# Patient Record
Sex: Male | Born: 1955 | Race: White | Hispanic: No | Marital: Married | Smoking: Never smoker
Health system: Southern US, Community
[De-identification: ages and names within clinical notes are randomized; demographics above are authoritative.]

## PROBLEM LIST (undated history)

## (undated) DIAGNOSIS — I10 Essential (primary) hypertension: Secondary | ICD-10-CM

## (undated) DIAGNOSIS — N400 Enlarged prostate without lower urinary tract symptoms: Secondary | ICD-10-CM

## (undated) DIAGNOSIS — Z789 Other specified health status: Secondary | ICD-10-CM

## (undated) HISTORY — PX: TONSILLECTOMY: SUR1361

## (undated) HISTORY — PX: TOOTH EXTRACTION: SUR596

---

## 1980-04-23 DIAGNOSIS — A159 Respiratory tuberculosis unspecified: Secondary | ICD-10-CM

## 1980-04-23 HISTORY — DX: Respiratory tuberculosis unspecified: A15.9

## 2006-08-16 ENCOUNTER — Ambulatory Visit: Payer: Self-pay | Admitting: Sports Medicine

## 2006-08-16 DIAGNOSIS — M2140 Flat foot [pes planus] (acquired), unspecified foot: Secondary | ICD-10-CM | POA: Insufficient documentation

## 2006-08-16 DIAGNOSIS — M21169 Varus deformity, not elsewhere classified, unspecified knee: Secondary | ICD-10-CM

## 2006-08-16 DIAGNOSIS — IMO0002 Reserved for concepts with insufficient information to code with codable children: Secondary | ICD-10-CM | POA: Insufficient documentation

## 2006-09-17 ENCOUNTER — Ambulatory Visit: Payer: Self-pay | Admitting: Sports Medicine

## 2006-09-17 DIAGNOSIS — M76829 Posterior tibial tendinitis, unspecified leg: Secondary | ICD-10-CM | POA: Insufficient documentation

## 2006-10-09 ENCOUNTER — Ambulatory Visit: Payer: Self-pay | Admitting: Sports Medicine

## 2006-11-12 ENCOUNTER — Ambulatory Visit: Payer: Self-pay | Admitting: Family Medicine

## 2008-03-30 ENCOUNTER — Ambulatory Visit: Payer: Self-pay | Admitting: Sports Medicine

## 2008-03-30 DIAGNOSIS — M79609 Pain in unspecified limb: Secondary | ICD-10-CM

## 2010-06-27 ENCOUNTER — Encounter (INDEPENDENT_AMBULATORY_CARE_PROVIDER_SITE_OTHER): Payer: BC Managed Care – PPO | Admitting: Sports Medicine

## 2010-06-27 ENCOUNTER — Encounter: Payer: Self-pay | Admitting: Sports Medicine

## 2010-06-27 DIAGNOSIS — M79609 Pain in unspecified limb: Secondary | ICD-10-CM

## 2010-06-27 DIAGNOSIS — M76829 Posterior tibial tendinitis, unspecified leg: Secondary | ICD-10-CM

## 2010-06-27 DIAGNOSIS — R269 Unspecified abnormalities of gait and mobility: Secondary | ICD-10-CM | POA: Insufficient documentation

## 2010-07-04 NOTE — Assessment & Plan Note (Signed)
Summary: ORTHOTICS PER [facility]   Vital Signs:  Patient profile:   55 year old male Height:      61 inches Weight:      120 pounds BMI:     22.76 BP sitting:   174 / 90  Vitals Entered By: Lillia Pauls CMA (June 27, 2010 9:23 AM)   History of Present Illness: Training for a 100 mile run doing a long run of 25 mpw and a total of 50 to 80 MPW originally had post tib tendinitis and chronic ankle pain since put into these orthotics has done well with much less pain orthotics now not stopping pain RT foot some forefoot pain both ankles getting more pressure now  current orthotics are 45 1/56 years old and have 4000+ miles  now showing signs of wear but still have 1st ray post and heel pad intact forefoot is broken down  Preventive Screening-Counseling & Management  Alcohol-Tobacco     Smoking Status: never  Allergies (verified): No Known Drug Allergies  Physical Exam  General:  Well-developed,well-nourished,in no acute distress; alert,appropriate and cooperative throughout examination Msk:  varus alignment RT leg is 1 cm shorter morton's foot bilat  marked pronation at mid foot also has reaer foot change w calcaneal valgus  forefoot shows loss of trnaverse arch bilat more on RT but no morton's callus Extremities:  Gait is pronated bilat without orthotics   Impression & Recommendations:  Problem # 1:  ABNORMALITY OF GAIT (ICD-781.2) Assessment Unchanged  this is corrected well w orthotics has tried goind without these for shorter runs and even w this gets a return of pain will need to cont these indefinitely for distance running  Patient was fitted for a standard, cushioned, semi-rigid orthotic.  The orthotic was heated and the patient stood on the orthotic blank positioned on the orthotic stand. The patient was positioned in subtalar neutral position and 10 degrees of ankle dorsiflexion in a weight bearing stance. After completion of molding a stable based was  applied to the orthotic blank.   The blank was ground to a stable position for weight bearing. size 9 blue swirl base blue med dens EVA posting f irst Ray additional orthotic padding  RT heel foam  time 45 mins  Orders: Orthotic Materials, each unit [phone])  Problem # 2:  LEG PAIN, RIGHT (ICD-729.5) Assessment: Deteriorated  this seems to return more on RT with increased orthotic wear  recushion this area and see if this resolves with new orthotic  reck as needed if working  Orders: Games developer, each unit [id number])  Problem # 3:  TENDINITIS, TIBIALIS (ICD-726.72) Assessment: Improved  This is recurrent but resolves as long as he uses orthtoics  seems 2/2 his gait change with marked pronatino  Orders: Orthotic Materials, each unit (L3002)   Orders Added: 1)  Est. Patient Level IV [09811] 2)  Orthotic Materials, each unit [L3002]  Appended Document: ORTHOTICS PER [facility] Pt's BP was rechecked manually and was 190/96- scheduled him for appt with Dr. Janalyn Harder at [facility] 07/10/10 to establish care, and have BP followed.

## 2010-07-10 ENCOUNTER — Ambulatory Visit: Payer: BC Managed Care – PPO | Admitting: Family Medicine

## 2014-03-30 ENCOUNTER — Encounter: Payer: Self-pay | Admitting: Sports Medicine

## 2014-03-30 ENCOUNTER — Ambulatory Visit (INDEPENDENT_AMBULATORY_CARE_PROVIDER_SITE_OTHER): Payer: BC Managed Care – PPO | Admitting: Sports Medicine

## 2014-03-30 VITALS — BP 147/93 | Ht 61.0 in | Wt 123.0 lb

## 2014-03-30 DIAGNOSIS — M25461 Effusion, right knee: Secondary | ICD-10-CM | POA: Diagnosis not present

## 2014-03-30 NOTE — Assessment & Plan Note (Signed)
Will use body helix comp  Cross train  Relative rest  NSAIDs

## 2014-03-30 NOTE — Progress Notes (Signed)
Patient ID: Justin Baxter, male   DOB: 09/24/1955, [age] y.o.   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  Pain started 5 days ago and is localized to right medial knee.  Pain is sharp and achy in nature.  Patient had been carrying his granddaughter prior to onset of pain.  The day after pain onset he also noticed right knee swelling.  Pain is worse with any weight-bearing and when putting valgus stress on his knee.  Pain also worse with movement after prolonged inactivity.  He took NSAIDS once with no relief.  He has been wearing a brace for support which helps mildly.  He does not feels this is not running related as there has been no significant trauma/injury or change in mileage.  He did recently run a 100 mile race.  He does have a history of fracture to right proximal tibia in 2007.  No numbness/tingling in leg.  He has no catching, locking, or giving out of this knee.  There has been no erythema and he has no history of gout, RA, or Lupus.  No fever, chills, night sweats, weight loss.  Objective: BP 147/93 mmHg  Ht 5\' 1"  (1.549 m)  Wt 123 lb (55.792 kg)  BMI 23.25 kg/m2 General: calm, cooperative, NAD HEENT: conj clear, sclera anicteric, [place]/AT Respiratory: breathing non-labored Cardiac: lower extremity pulses intact Neurologic: LE strength/sensation intact Musculoskeletal:  Right Knee Exam:  Inspection: large joint effusion; no bruising or ecchymosis or erythema  Palpation: tender over right medial joint line; no tenderness over pes bursa, hamstring tendons, quad tendons  ROM: passive flexion/extension normal; pain with flexion of 60 degrees  Anterior/Posterior drawer: negative  Lachmann's: negative  Varus/Valgus stress: negative  McMurrays: pain but no palpable click with testing of medial meniscus  Thessaly's: painful  Internal rotation of hip: negative  Ultrasound of Right Knee: -Large 5 cm long joint effusion  extending into SPP Baker's cyst of posterior knee tracking medially around semimembranosus and gastrocnemius tendons No real spurring noted -Meniscus appears grossly normal with exception of pseudocyst located on posterior aspect of medial joint line  Assessment/Plan:  Patient is a 58 y/o with atraumatic onset of right knee pain and effusion likely secondary to OCD lesion or cartilage contusion/ possible degen. meniscus  1.  Right Knee Pain 2.  Right Knee Effusion 3.  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  Offered joint aspiration/injection today but patient declined.  No obvious meniscal pathology on ultrasound but this remains in the differential.  Osteoarthritis also remains on differential given prior fracture of proximal tibia.  Will proceed with period of rest and activity modification.  Patient can use exercise bike as tolerated to maintain his fitness.  He was advised to wear compressive knee brace to help improve his right knee effusion.  He was encouraged to take scheduled NSAIDS for the next 7-10 days.  He will follow-up with us in 4 weeks to monitor for improvement.  Patient examined and discussed with Dr. Darrick PennaFields.  Mickle PlumbEvan Lutz, MD PGY-3 Family Medicine  Agree with evaluation and edited note/  Sterling BigKB Bonnee Zertuche, MD

## 2014-03-31 ENCOUNTER — Ambulatory Visit: Payer: BC Managed Care – PPO | Admitting: Sports Medicine

## 2014-05-04 ENCOUNTER — Ambulatory Visit (INDEPENDENT_AMBULATORY_CARE_PROVIDER_SITE_OTHER): Payer: BLUE CROSS/BLUE SHIELD | Admitting: Sports Medicine

## 2014-05-04 ENCOUNTER — Encounter: Payer: Self-pay | Admitting: Sports Medicine

## 2014-05-04 VITALS — BP 142/92 | Ht 61.0 in | Wt 123.0 lb

## 2014-05-04 DIAGNOSIS — M21161 Varus deformity, not elsewhere classified, right knee: Secondary | ICD-10-CM | POA: Diagnosis not present

## 2014-05-04 DIAGNOSIS — M25461 Effusion, right knee: Secondary | ICD-10-CM | POA: Diagnosis not present

## 2014-05-04 NOTE — Progress Notes (Signed)
  Justin Baxter - 59 y.o. male MRN [id number]  Date of birth: 05/21/1955  SUBJECTIVE:  Including CC & ROS.  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 When seen in early December in had developed sharp medial joint line pain and swelling. A large large effusion and baker cyst was seen on US. Patient treatment is swelling with compression sleeve and NSAID'S and feels the swelling has resolved but the intermittent medial jointline pain with extension is still present. No numbness/tingling in leg.  He has some catching,no  locking, or giving out of this knee.     ROS: Review of systems otherwise negative except for information present in HPI  HISTORY: Past Medical, Surgical, Social, and Family History Reviewed & Updated per EMR. Pertinent Historical Findings include:  He does have a history of fracture to right proximal tibia in 2007.   DATA REVIEWED: Review patient previous US from 03/30/14 which showed a large joint effusion and moderate bakers cyst tacking medially around semimembranosus/ gastroc. And pseudocyst on posterior aspect of medial jointline   PHYSICAL EXAM:  VS: BP:(!) 142/92 mmHg  HR: bpm  TEMP: ( )  RESP:   HT:5\' 1"  (154.9 cm)   WT:123 lb (55.792 kg)  BMI:23.3 KNEE EXAM:  General: well nourished, no acute distress Skin of LE: warm; dry, no rashes, lesions, ecchymosis or erythema. Vascular: Dorsal pedal pulses 2+ bilaterally Neurologically: Sensation to light touch lower extremities equal and intact  Normal to inspection with no erythema or effusion or obvious bony abnormalities. Palpation: Medial joint line tenderness Normal with no warmth, patellar tenderness, or condyle tenderness. ROM normal in flexion and extension and lower leg rotation. Range of motion:  ROM normal in flexion and extension and lower leg rotation. Ligaments with solid consistent endpoints  including ACL, PCL, LCL, MCL. Negative patella apprehension and normal tracking Meniscal evaluation: positive medial McMurray's test, painfulthessaly's test Hamstring and quadriceps strength is normal.  MSK US: Patient effusion has mostly resolved with only small amount in the suprapatellar recess, backer cyst has resolved, and pseudocyst has resolved and medial and lateral meniscus are normal   ASSESSMENT & PLAN: See problem based charting & AVS for pt instructions. Assessment/Plan:  Patient is a 59 y/o with atraumatic onset of right knee pain and effusion likely related to possible degen. Meniscus. Most of the effusion has resolved.   Recommendations: - Continue knee sleeve and provided patient with new sleeve today - Recommend some quad strengthening exercises - Cross-training for 2 more weeks then slowly progress back into running with half regular mileage the first 2 weeks back with a slow increase in mileage for 4 weeks

## 2017-03-05 ENCOUNTER — Encounter: Payer: Self-pay | Admitting: Sports Medicine

## 2017-03-05 ENCOUNTER — Ambulatory Visit: Payer: BLUE CROSS/BLUE SHIELD | Admitting: Family Medicine

## 2017-03-05 ENCOUNTER — Ambulatory Visit: Payer: BC Managed Care – PPO | Admitting: Sports Medicine

## 2017-03-05 VITALS — BP 178/100 | Ht 61.0 in | Wt 122.0 lb

## 2017-03-05 DIAGNOSIS — S76012A Strain of muscle, fascia and tendon of left hip, initial encounter: Secondary | ICD-10-CM

## 2017-03-05 MED ORDER — MELOXICAM 15 MG PO TABS: ORAL_TABLET | ORAL | 0 refills | Status: DC

## 2017-03-05 NOTE — Progress Notes (Signed)
Chief complaint: Left-sided glute pain x one day  History of present illness: Delton Seeelson is a 61 year old male who presents to the sports medicine office today with chief complaint of left-sided glue pain. He reports that symptoms started yesterday. He reports that he was cutting down a tree, was cutting it to about 3-4 sizes and then lifting it. He reports that after finishing he started noticing pain and discomfort in his low back on the left side of his glute muscles. He reports that symptoms progressively worsened throughout the course of the afternoon. Reports pain today as a 10/10. He describes the pain as a sharp, stabbing pain. He reports that the pain is nonradiating. He does not report of any numbness, tingling, or burning paresthesias. He does not report of any low back pain or hip pain. He does not report of any bowel or bladder incontinence, does not report of any saddle anesthesia. He does not report of any fevers, chills, night sweats, or any unintentional weight loss. He reports that he did take 2 Advil yesterday afternoon, notices minimal improvement in symptoms. He reports that he has been icing, with no improvement in symptoms.  Review of systems:  As stated above  Interval past medical history, surgical history, family history, and social history obtained and unchanged. He does have history of hypertension, no history of diabetes, does not report of any current tobacco use, no surgical history regarding back, does not report a family history of hypertension or any autoimmune disorder  Physical exam: Vital signs are reviewed and are documented in the chart Gen.: Alert, oriented, appears stated age, in no apparent distress HEENT: Moist oral mucosa Respiratory: Normal respirations, able to speak in full sentences Cardiac: Regular rate, distal pulses 2+ Integumentary: No rashes on visible skin:  Neurologic: Strength 5/5, sensation 2+ in bilateral lower extremities Psych: Normal affect,  mood is described as good Musculoskeletal: Inspection of low back and gluteal muscles reveal no obvious deformity or muscle atrophy, no warmth, erythema, ecchymosis, or effusion, he is point tender over the gluteal muscles on the left side, no tenderness over the lumbar spine, paraspinal lumbar processes, or SI joint bilaterally, no tenderness over the right gluteal muscles, straight leg negative bilaterally, FABER and FADIR negative, he has normal hip range of motion, does have good strength with hip flexion, hip extension, hip abductor, quad, hamstring strength testing  Assessment and plan: 1. Left gluteal muscle strain  Plan: Do not see any evidence on physical examination or history to be concerned regarding any type of radiculopathy or disc herniation, given negative symptoms of radiculopathy as well as negative straight leg testing bilaterally. Do feel that symptoms are consistent with a muscle strain over the left gluteal muscles. Do feel that he will do quite well with conservative therapy, discussed use of anti-inflammatory medication scheduled for the first 3-4 days, then daily as needed there afterwards. Will send in meloxicam 15 mg daily. Discussed home exercise program to do at home, alternating heat and ice. Discussed if no improvement in symptoms in the next 3-4 days to return to office for reevaluation.  Haynes Kernshristopher Jadis Mika, M.D. Primary Care Sports Medicine Fellow [facility]

## 2019-04-06 ENCOUNTER — Encounter (HOSPITAL_COMMUNITY): Payer: Self-pay | Admitting: Emergency Medicine

## 2019-04-06 ENCOUNTER — Emergency Department (HOSPITAL_COMMUNITY): Payer: BC Managed Care – PPO

## 2019-04-06 ENCOUNTER — Emergency Department (HOSPITAL_COMMUNITY)
Admission: EM | Admit: 2019-04-06 | Discharge: 2019-04-06 | Disposition: A | Discharge status: Home / Self Care | Payer: BC Managed Care – PPO | Attending: Emergency Medicine | Admitting: Emergency Medicine

## 2019-04-06 ENCOUNTER — Other Ambulatory Visit: Payer: Self-pay

## 2019-04-06 DIAGNOSIS — R1030 Lower abdominal pain, unspecified: Secondary | ICD-10-CM | POA: Diagnosis not present

## 2019-04-06 DIAGNOSIS — I10 Essential (primary) hypertension: Secondary | ICD-10-CM | POA: Insufficient documentation

## 2019-04-06 DIAGNOSIS — Z79899 Other long term (current) drug therapy: Secondary | ICD-10-CM | POA: Diagnosis not present

## 2019-04-06 DIAGNOSIS — R339 Retention of urine, unspecified: Secondary | ICD-10-CM | POA: Diagnosis present

## 2019-04-06 HISTORY — DX: Essential (primary) hypertension: I10

## 2019-04-06 LAB — CBC WITH DIFFERENTIAL/PLATELET
Abs Immature Granulocytes: 0.05 10*3/uL (ref 0.00–0.07)
Basophils Absolute: 0 10*3/uL (ref 0.0–0.1)
Basophils Relative: 0 %
Eosinophils Absolute: 0 10*3/uL (ref 0.0–0.5)
Eosinophils Relative: 0 %
HCT: 39.4 % (ref 39.0–52.0)
Hemoglobin: 13.2 g/dL (ref 13.0–17.0)
Immature Granulocytes: 0 %
Lymphocytes Relative: 3 %
Lymphs Abs: 0.5 10*3/uL — ABNORMAL LOW (ref 0.7–4.0)
MCH: 29.7 pg (ref 26.0–34.0)
MCHC: 33.5 g/dL (ref 30.0–36.0)
MCV: 88.5 fL (ref 80.0–100.0)
Monocytes Absolute: 0.8 10*3/uL (ref 0.1–1.0)
Monocytes Relative: 5 %
Neutro Abs: 14.9 10*3/uL — ABNORMAL HIGH (ref 1.7–7.7)
Neutrophils Relative %: 92 %
Platelets: 318 10*3/uL (ref 150–400)
RBC: 4.45 MIL/uL (ref 4.22–5.81)
RDW: 15.4 % (ref 11.5–15.5)
WBC: 16.3 10*3/uL — ABNORMAL HIGH (ref 4.0–10.5)
nRBC: 0 % (ref 0.0–0.2)

## 2019-04-06 LAB — URINALYSIS, ROUTINE W REFLEX MICROSCOPIC
Bilirubin Urine: NEGATIVE
Glucose, UA: NEGATIVE mg/dL
Ketones, ur: 5 mg/dL — AB
Leukocytes,Ua: NEGATIVE
Nitrite: NEGATIVE
Protein, ur: 30 mg/dL — AB
Specific Gravity, Urine: 1.021 (ref 1.005–1.030)
pH: 5 (ref 5.0–8.0)

## 2019-04-06 LAB — BASIC METABOLIC PANEL
Anion gap: 14 (ref 5–15)
BUN: 37 mg/dL — ABNORMAL HIGH (ref 8–23)
CO2: 24 mmol/L (ref 22–32)
Calcium: 9.5 mg/dL (ref 8.9–10.3)
Chloride: 99 mmol/L (ref 98–111)
Creatinine, Ser: 1.47 mg/dL — ABNORMAL HIGH (ref 0.61–1.24)
GFR calc Af Amer: 58 mL/min — ABNORMAL LOW (ref >60)
GFR calc non Af Amer: 50 mL/min — ABNORMAL LOW (ref >60)
Glucose, Bld: 142 mg/dL — ABNORMAL HIGH (ref 70–99)
Potassium: 4.1 mmol/L (ref 3.5–5.1)
Sodium: 137 mmol/L (ref 135–145)

## 2019-04-06 LAB — CK: Total CK: 1987 U/L — ABNORMAL HIGH (ref 49–397)

## 2019-04-06 MED ORDER — SODIUM CHLORIDE 0.9 % IV BOLUS
1000.0000 mL | Freq: Once | INTRAVENOUS | Status: AC
  Administered 2019-04-06: 13:00:00 1000 mL via INTRAVENOUS

## 2019-04-06 MED ORDER — IOHEXOL 300 MG/ML  SOLN
100.0000 mL | Freq: Once | INTRAMUSCULAR | Status: AC | PRN
  Administered 2019-04-06: 11:00:00 100 mL via INTRAVENOUS

## 2019-04-06 MED ORDER — SODIUM CHLORIDE 0.9 % IV SOLN
1.0000 g | Freq: Once | INTRAVENOUS | Status: AC
  Administered 2019-04-06: 13:00:00 1 g via INTRAVENOUS
  Filled 2019-04-06: qty 10

## 2019-04-06 MED ORDER — CEPHALEXIN 500 MG PO CAPS: 500.0000 mg | ORAL_CAPSULE | Freq: Four times a day (QID) | ORAL | 0 refills | Status: DC

## 2019-04-06 MED ORDER — HYDROCODONE-ACETAMINOPHEN 5-325 MG PO TABS
1.0000 | ORAL_TABLET | Freq: Once | ORAL | Status: DC
  Filled 2019-04-06: qty 1

## 2019-04-06 NOTE — ED Notes (Signed)
Patient verbalizes understanding of discharge instructions. Opportunity for questioning and answers were provided. Armband removed by staff, pt discharged from ED. Ambulated out to lobby  

## 2019-04-06 NOTE — ED Notes (Signed)
Unclamped catheter, another 567ml output

## 2019-04-06 NOTE — ED Triage Notes (Addendum)
Pt in with c/o abdominal pain, swelling, along with urinary retention and constipation. 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 Small BM yesterday, inability to pass gas and reports frequent burps w/nausea. Swollen area firm to touch, denies any worse pain on palpation. Unable to void since yesterday

## 2019-04-06 NOTE — ED Notes (Signed)
Pt given leg bag

## 2019-04-06 NOTE — ED Notes (Signed)
Pt initially put out 1L in catheter. Clamped temporarily

## 2019-04-06 NOTE — Discharge Instructions (Addendum)
Follow up with your Urologist. Increase your fluid intake.

## 2019-04-06 NOTE — ED Provider Notes (Signed)
MSE was initiated and I personally evaluated the patient and placed orders (if any) at  6:22 AM on April 06, 2019.  The patient appears stable so that the remainder of the MSE may be completed by another provider.  Patient to ED with urinary retention since yesterday afternoon (04/05/19). 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 He states during the race at the end he felt he needed to have a bowel movement, and felt that same urge several times on the drive home. They stopped multiple times and he would have only small, nonmelanic bowel movements. No nausea, vomiting, fever, chest pain, SOB, testicular pain or scrotal swelling.   ON exam: he is well appearing. Distended and tender lower abdomen c/w urinary retention/distended bladder. He is hypertensive but reports he did not take his medication yesterday.   Initial labs ordered, bladder scan and foley cath.   Charlann Lange, PA-C 04/06/19 5747    Fatima Blank, MD 04/06/19 [phone]

## 2019-04-07 LAB — URINE CULTURE: Culture: NO GROWTH

## 2019-04-20 NOTE — ED Provider Notes (Signed)
[facility] Provider Note   CSN: [id number] Arrival date & time: 04/06/19  0554     History Chief Complaint  Patient presents with  . Abdominal Pain    Justin Baxter is a 63 y.o. male.  HPI   Patient presents to the emergency department with urinary retention that started last night.  The patient states that he started having abdominal pain right over the bladder region with distention.  The patient states that he was having trouble urinating.  The patient states that he does have a history of enlarged prostate.  Patient states that he has seen a urologist in the past.  Patient states that he has not started any new medications.  The patient denies chest pain, shortness of breath, headache,blurred vision, neck pain, fever, cough, weakness, numbness, dizziness, anorexia, edema, nausea, vomiting, diarrhea, rash, back pain, dysuria, hematemesis, bloody stool, near syncope, or syncope. Past Medical History:  Diagnosis Date  . Hypertension     Patient Active Problem List   Diagnosis Date Noted  . Effusion of right knee joint 03/30/2014  . ABNORMALITY OF GAIT 06/27/2010  . LEG PAIN, RIGHT 03/30/2008  . TENDINITIS, TIBIALIS 09/17/2006  . ANSERINE BURSITIS, LEFT 08/16/2006  . PES PLANUS 08/16/2006  . 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 No pertinent surgical history.     No family history on file.  Social History   Tobacco Use  . Smoking status: Never Smoker  . Smokeless tobacco: Never Used  Substance Use Topics  . Alcohol use: Yes    Alcohol/week: 0.0 standard drinks  . Drug use: Never    Home Medications Prior to Admission medications   Medication Sig Start Date End Date Taking? Authorizing Provider  cephALEXin (KEFLEX) 500 MG capsule Take 1 capsule (500 mg total) by mouth 4 (four) times daily. 04/06/19   Cheryl Stabenow, Cristal Deerhristopher, PA-C  meloxicam (MOBIC) 15 MG tablet Take 1 pill a day for 3 days. Then take as  needed. 03/05/17   Ralene Corkraper, Timothy R, DO  valsartan (DIOVAN) 160 MG tablet Take 160 mg by mouth daily. 02/04/14   [provider]    Allergies    Patient has no known allergies.  Review of Systems   Review of Systems All other systems negative except as documented in the HPI. All pertinent positives and negatives as reviewed in the HPI.  Physical Exam Updated Vital Signs BP 133/74   Pulse 61   Temp 97.9 F (36.6 C) (Oral)   Resp 20   Wt 55.3 kg   SpO2 99%   BMI 23.04 kg/m   Physical Exam Vitals and nursing note reviewed.  Constitutional:      General: He is not in acute distress.    Appearance: He is well-developed.  HENT:     Head: Normocephalic and atraumatic.  Eyes:     Pupils: Pupils are equal, round, and reactive to light.  Cardiovascular:     Rate and Rhythm: Normal rate and regular rhythm.     Heart sounds: Normal heart sounds. No murmur. No friction rub. No gallop.   Pulmonary:     Effort: Pulmonary effort is normal. No respiratory distress.     Breath sounds: Normal breath sounds. No wheezing.  Abdominal:     General: Bowel sounds are normal. There is no distension.     Palpations: Abdomen is soft.     Tenderness: There is abdominal tenderness in the right lower quadrant, suprapubic area and left lower  quadrant.  Musculoskeletal:     Cervical back: Normal range of motion and neck supple.  Skin:    General: Skin is warm and dry.     Capillary Refill: Capillary refill takes less than 2 seconds.     Findings: No erythema or rash.  Neurological:     Mental Status: He is alert and oriented to person, place, and time.     Motor: No abnormal muscle tone.     Coordination: Coordination normal.  Psychiatric:        Behavior: Behavior normal.     ED Results / Procedures / Treatments   Labs (all labs ordered are listed, but only abnormal results are displayed) Labs Reviewed  CBC WITH DIFFERENTIAL/PLATELET - Abnormal; Notable for the following  components:      Result Value   WBC 16.3 (*)    Neutro Abs 14.9 (*)    Lymphs Abs 0.5 (*)    All other components within normal limits  BASIC METABOLIC PANEL - Abnormal; Notable for the following components:   Glucose, Bld 142 (*)    BUN 37 (*)    Creatinine, Ser 1.47 (*)    GFR calc non Af Amer 50 (*)    GFR calc Af Amer 58 (*)    All other components within normal limits  URINALYSIS, ROUTINE W REFLEX MICROSCOPIC - Abnormal; Notable for the following components:   Hgb urine dipstick MODERATE (*)    Ketones, ur 5 (*)    Protein, ur 30 (*)    Bacteria, UA RARE (*)    All other components within normal limits  CK - Abnormal; Notable for the following components:   Total CK 1,987 (*)    All other components within normal limits  URINE CULTURE    EKG None  Radiology No results found.  Procedures Procedures (including critical care time)  Medications Ordered in ED Medications  iohexol (OMNIPAQUE) 300 MG/ML solution 100 mL (100 mLs Intravenous Contrast Given 04/06/19 1101)  sodium chloride 0.9 % bolus 1,000 mL (0 mLs Intravenous Stopped 04/06/19 1521)  cefTRIAXone (ROCEPHIN) 1 g in sodium chloride 0.9 % 100 mL IVPB (0 g Intravenous Stopped 04/06/19 1521)    ED Course  I have reviewed the triage vital signs and the nursing notes.  Pertinent labs & imaging results that were available during my care of the patient were reviewed by me and considered in my medical decision making (see chart for details).    MDM Rules/Calculators/A&P                      The patient is given antibiotics and will be discharged home on antibiotics.  The patient is advised he will need to leave the Foley catheter in place until he follows up with urology.  Patient is advised of the results and all questions were answered.  The patient had significant relief of his symptoms after draining his bladder.  Patient still had some discomfort therefore we did order a CT scan to further evaluate his  symptoms to make sure there was no significant abnormality it was causing an issue for the patient.  Patient will be referred to urology for further evaluation and care. Final Clinical Impression(s) / ED Diagnoses Final diagnoses:  Urinary retention    Rx / DC Orders ED Discharge Orders         Ordered    cephALEXin (KEFLEX) 500 MG capsule  4 times daily     04/06/19 1440  Charlestine Night, PA-C 04/25/19 Sharman Crate    Arby Barrette, MD 04/25/19 1444

## 2019-04-30 ENCOUNTER — Ambulatory Visit: Payer: BC Managed Care – PPO | Attending: Internal Medicine

## 2019-04-30 DIAGNOSIS — Z20822 Contact with and (suspected) exposure to covid-19: Secondary | ICD-10-CM

## 2019-05-02 LAB — NOVEL CORONAVIRUS, NAA: SARS-CoV-2, NAA: NOT DETECTED

## 2019-06-04 ENCOUNTER — Ambulatory Visit: Payer: BC Managed Care – PPO | Attending: Internal Medicine

## 2019-06-04 DIAGNOSIS — Z23 Encounter for immunization: Secondary | ICD-10-CM | POA: Insufficient documentation

## 2019-06-04 NOTE — Progress Notes (Signed)
   Covid-19 Vaccination Clinic  Name:  Justin Baxter    MRN: [id number] DOB: 1955/08/01  06/04/2019  Mr. [name] was observed post Covid-19 immunization for 15 minutes without incidence. He was provided with Vaccine Information Sheet and instruction to access the V-Safe system.   Mr. [name] was instructed to call 911 with any severe reactions post vaccine: Marland Kitchen Difficulty breathing  . Swelling of your face and throat  . A fast heartbeat  . A bad rash all over your body  . Dizziness and weakness    Immunizations Administered    Name Date Dose VIS Date Route   Pfizer COVID-19 Vaccine 06/04/2019  2:02 PM 0.3 mL 04/03/2019 Intramuscular   Manufacturer: [employer], [employer]   Lot: [id number]   NDC: [address]-0

## 2019-06-27 ENCOUNTER — Ambulatory Visit: Payer: BC Managed Care – PPO | Attending: Internal Medicine

## 2019-06-27 DIAGNOSIS — Z23 Encounter for immunization: Secondary | ICD-10-CM

## 2019-06-27 NOTE — Progress Notes (Signed)
   Covid-19 Vaccination Clinic  Name:  Justin Baxter    MRN: [id number] DOB: 11/20/1955  06/27/2019  [name] was observed post Covid-19 immunization for 15 minutes without incident. He was provided with Vaccine Information Sheet and instruction to access the V-Safe system.   [name] was instructed to call 911 with any severe reactions post vaccine: Marland Kitchen Difficulty breathing  . Swelling of face and throat  . A fast heartbeat  . A bad rash all over body  . Dizziness and weakness   Immunizations Administered    Name Date Dose VIS Date Route   Pfizer COVID-19 Vaccine 06/27/2019 12:37 PM 0.3 mL 04/03/2019 Intramuscular   Manufacturer: [employer], [employer]   Lot: [id number]   NDC: [address]-1

## 2019-06-29 ENCOUNTER — Other Ambulatory Visit: Payer: Self-pay | Admitting: Urology

## 2019-07-02 NOTE — Patient Instructions (Addendum)
DUE TO COVID-19 ONLY ONE VISITOR IS ALLOWED TO COME WITH YOU AND STAY IN THE WAITING ROOM ONLY DURING PRE OP AND PROCEDURE DAY OF SURGERY. THE 1 VISITOR MAY VISIT WITH YOU AFTER SURGERY IN YOUR PRIVATE ROOM DURING VISITING HOURS ONLY!  YOU NEED TO HAVE A COVID 19 TEST ON__4/16_____ @__9 :05_____, THIS TEST MUST BE DONE BEFORE SURGERY, COME  [address], [place] , [address].  ([place]) ONCE YOUR COVID TEST IS COMPLETED, PLEASE BEGIN THE QUARANTINE INSTRUCTIONS AS OUTLINED IN YOUR HANDOUT.                Shellee Milo    Your procedure is scheduled on: 08/11/19   Report to [facility] Main  Entrance   Report to admitting at  9:30 AM     Call this number if you have problems the morning of surgery [phone]    Remember: Do not eat food or drink liquids :After Midnight.   BRUSH YOUR TEETH MORNING OF SURGERY AND RINSE YOUR MOUTH OUT, NO CHEWING GUM CANDY OR MINTS.     Take these medicines the morning of surgery with A SIP OF WATER: Tamsulosin                                 You may not have any metal on your body including              piercings  Do not wear jewelry,  lotions, powders or  deodorant               Men may shave face and neck.   Do not bring valuables to the hospital. [place].  Contacts, dentures or bridgework may not be worn into surgery.      Patients discharged the day of surgery will not be allowed to drive home.   IF YOU ARE HAVING SURGERY AND GOING HOME THE SAME DAY, YOU MUST HAVE AN ADULT TO DRIVE YOU HOME AND BE WITH YOU FOR 24 HOURS.   YOU MAY GO HOME BY TAXI OR UBER OR ORTHERWISE, BUT AN ADULT MUST ACCOMPANY YOU HOME AND STAY WITH YOU FOR 24 HOURS.  Name and phone number of your driver:  Special Instructions: N/A              Please read over the following fact sheets you were given: _____________________________________________________________________  [facility] - Preparing for Surgery  Before surgery, you can play an important role.   Because skin is not sterile, your skin needs to be as free of germs as possible.   You can reduce the number of germs on your skin by washing with CHG (chlorahexidine gluconate) soap before surgery.   CHG is an antiseptic cleaner which kills germs and bonds with the skin to continue killing germs even after washing. Please DO NOT use if you have an allergy to CHG or antibacterial soaps.   If your skin becomes reddened/irritated stop using the CHG and inform your nurse when you arrive at Short Stay.   You may shave your face/neck. Please follow these instructions carefully:  1.  Shower with CHG Soap the night before surgery and the  morning of Surgery.  2.  If you choose to wash your hair, wash your hair first as usual with your  normal  shampoo.  3.  After you shampoo, rinse your hair and body thoroughly to remove the  shampoo.                                        4.  Use CHG as you would any other liquid soap.  You can apply chg directly  to the skin and wash                       Gently with a scrungie or clean washcloth.  5.  Apply the CHG Soap to your body ONLY FROM THE NECK DOWN.   Do not use on face/ open                           Wound or open sores. Avoid contact with eyes, ears mouth and genitals (private parts).                       Wash face,  Genitals (private parts) with your normal soap.             6.  Wash thoroughly, paying special attention to the area where your surgery  will be performed.  7.  Thoroughly rinse your body with warm water from the neck down.  8.  DO NOT shower/wash with your normal soap after using and rinsing off  the CHG Soap.             9.  Pat yourself dry with a clean towel.            10.  Wear clean pajamas.            11.  Place clean sheets on your bed the night of your first shower and do not  sleep with pets. Day of Surgery : Do not apply any lotions/deodorants  the morning of surgery.  Please wear clean clothes to the hospital/surgery center.  FAILURE TO FOLLOW THESE INSTRUCTIONS MAY RESULT IN THE CANCELLATION OF YOUR SURGERY PATIENT SIGNATURE_________________________________  NURSE SIGNATURE__________________________________  ________________________________________________________________________

## 2019-07-03 ENCOUNTER — Other Ambulatory Visit: Payer: Self-pay

## 2019-07-03 ENCOUNTER — Other Ambulatory Visit (HOSPITAL_COMMUNITY): Payer: BC Managed Care – PPO

## 2019-07-03 ENCOUNTER — Encounter (HOSPITAL_COMMUNITY): Payer: Self-pay

## 2019-07-03 ENCOUNTER — Encounter (HOSPITAL_COMMUNITY)
Admission: RE | Admit: 2019-07-03 | Discharge: 2019-07-03 | Disposition: A | Discharge status: Home / Self Care | Payer: BC Managed Care – PPO | Source: Ambulatory Visit | Attending: Urology | Admitting: Urology

## 2019-07-03 ENCOUNTER — Encounter (HOSPITAL_COMMUNITY): Admission: RE | Admit: 2019-07-03 | Payer: BC Managed Care – PPO | Source: Ambulatory Visit

## 2019-07-03 NOTE — Progress Notes (Signed)
PCP - Dr. Marcy Siren Cardiologist - none  Chest x-ray - none EKG - none Stress Test - no ECHO - no Cardiac Cath - no  Sleep Study - no CPAP -   Fasting Blood Sugar - NA Checks Blood Sugar _____ times a day  Blood Thinner Instructions:NA Aspirin Instructions: Last Dose:  Anesthesia review:   Patient denies shortness of breath, fever, cough and chest pain at PAT appointment yes  Patient verbalized understanding of instructions that were given to them at the PAT appointment. Patient was also instructed that they will need to review over the PAT instructions again at home before surgery. yes

## 2019-07-31 ENCOUNTER — Encounter (HOSPITAL_COMMUNITY): Payer: Self-pay

## 2019-07-31 NOTE — Patient Instructions (Signed)
DUE TO COVID-19 ONLY TWO VISITORS ARE ALLOWED TO COME WITH YOU AND STAY IN THE WAITING ROOM ONLY DURING PRE OP AND PROCEDURE. THE TWO VISITORS MAY VISIT WITH YOU IN YOUR PRIVATE ROOM DURING VISITING HOURS ONLY!!   COVID SWAB TESTING MUST BE COMPLETED ON:  Friday, August 07, 2019 [facility] [place] (Must self quarantine after testing. Follow instructions on handout.)             Your procedure is scheduled on: Tuesday, August 11, 2019   Report to [facility] Main  Entrance    Report to admitting at 9:30 AM   Call this number if you have problems the morning of surgery [phone]   Do not eat food or drink liquids :After Midnight.   Oral Hygiene is also important to reduce your risk of infection.                                    Remember - BRUSH YOUR TEETH THE MORNING OF SURGERY WITH YOUR REGULAR TOOTHPASTE   Do NOT smoke after Midnight   Take these medicines the morning of surgery with A SIP OF WATER: Tamsulosin                               You may not have any metal on your body including jewelry, and body piercings             Do not wear lotions, powders, perfumes/cologne, or deodorant                           Men may shave face and neck.   Do not bring valuables to the hospital. [place].   Contacts, dentures or bridgework may not be worn into surgery.   Bring small overnight bag day of surgery.   Special Instructions: Bring a copy of your healthcare power of attorney and living will documents         the day of surgery if you haven't scanned them in before.              Please read over the following fact sheets you were given: IF YOU HAVE QUESTIONS ABOUT YOUR PRE OP INSTRUCTIONS PLEASE CALL [phone]  [place] - Preparing for Surgery Before surgery, you can play an important role.  Because skin is not sterile, your  skin needs to be as free of germs as possible.  You can reduce the number of germs on your skin by washing with CHG (chlorahexidine gluconate) soap before surgery.  CHG is an antiseptic cleaner which kills germs and bonds with the skin to continue killing germs even after washing. Please DO NOT use if you have an allergy to CHG or antibacterial soaps.  If your skin becomes reddened/irritated stop using the CHG and inform your nurse when you arrive at Short Stay. Do not shave (including legs and underarms) for at least 48 hours prior to the first CHG shower.  You may shave your face/neck.  Please follow these instructions carefully:  1.  Shower with CHG Soap the night before surgery and the  morning  of surgery.  2.  If you choose to wash your hair, wash your hair first as usual with your normal  shampoo.  3.  After you shampoo, rinse your hair and body thoroughly to remove the shampoo.                             4.  Use CHG as you would any other liquid soap.  You can apply chg directly to the skin and wash.  Gently with a scrungie or clean washcloth.  5.  Apply the CHG Soap to your body ONLY FROM THE NECK DOWN.   Do   not use on face/ open                           Wound or open sores. Avoid contact with eyes, ears mouth and   genitals (private parts).                       Wash face,  Genitals (private parts) with your normal soap.             6.  Wash thoroughly, paying special attention to the area where your    surgery  will be performed.  7.  Thoroughly rinse your body with warm water from the neck down.  8.  DO NOT shower/wash with your normal soap after using and rinsing off the CHG Soap.                9.  Pat yourself dry with a clean towel.            10.  Wear clean pajamas.            11.  Place clean sheets on your bed the night of your first shower and do not  sleep with pets. Day of Surgery : Do not apply any lotions/deodorants the morning of surgery.  Please wear clean clothes to  the hospital/surgery center.  FAILURE TO FOLLOW THESE INSTRUCTIONS MAY RESULT IN THE CANCELLATION OF YOUR SURGERY  PATIENT SIGNATURE_________________________________  NURSE SIGNATURE__________________________________  ________________________________________________________________________

## 2019-08-03 ENCOUNTER — Encounter (HOSPITAL_COMMUNITY): Payer: Self-pay

## 2019-08-03 ENCOUNTER — Encounter (HOSPITAL_COMMUNITY)
Admission: RE | Admit: 2019-08-03 | Discharge: 2019-08-03 | Disposition: A | Discharge status: Home / Self Care | Payer: BC Managed Care – PPO | Source: Ambulatory Visit | Attending: Urology | Admitting: Urology

## 2019-08-03 ENCOUNTER — Other Ambulatory Visit: Payer: Self-pay

## 2019-08-03 DIAGNOSIS — Z01818 Encounter for other preprocedural examination: Secondary | ICD-10-CM | POA: Insufficient documentation

## 2019-08-03 DIAGNOSIS — R338 Other retention of urine: Secondary | ICD-10-CM | POA: Insufficient documentation

## 2019-08-03 DIAGNOSIS — I10 Essential (primary) hypertension: Secondary | ICD-10-CM | POA: Diagnosis not present

## 2019-08-03 DIAGNOSIS — N401 Enlarged prostate with lower urinary tract symptoms: Secondary | ICD-10-CM | POA: Diagnosis not present

## 2019-08-03 DIAGNOSIS — R001 Bradycardia, unspecified: Secondary | ICD-10-CM | POA: Insufficient documentation

## 2019-08-03 HISTORY — DX: Benign prostatic hyperplasia without lower urinary tract symptoms: N40.0

## 2019-08-03 HISTORY — DX: Other specified health status: Z78.9

## 2019-08-03 LAB — BASIC METABOLIC PANEL
Anion gap: 10 (ref 5–15)
BUN: 19 mg/dL (ref 8–23)
CO2: 27 mmol/L (ref 22–32)
Calcium: 9.3 mg/dL (ref 8.9–10.3)
Chloride: 101 mmol/L (ref 98–111)
Creatinine, Ser: 0.84 mg/dL (ref 0.61–1.24)
GFR calc Af Amer: >60 mL/min (ref >60)
GFR calc non Af Amer: >60 mL/min (ref >60)
Glucose, Bld: 105 mg/dL — ABNORMAL HIGH (ref 70–99)
Potassium: 4.4 mmol/L (ref 3.5–5.1)
Sodium: 138 mmol/L (ref 135–145)

## 2019-08-03 LAB — CBC
HCT: 37.8 % — ABNORMAL LOW (ref 39.0–52.0)
Hemoglobin: 12.3 g/dL — ABNORMAL LOW (ref 13.0–17.0)
MCH: 28.8 pg (ref 26.0–34.0)
MCHC: 32.5 g/dL (ref 30.0–36.0)
MCV: 88.5 fL (ref 80.0–100.0)
Platelets: 311 10*3/uL (ref 150–400)
RBC: 4.27 MIL/uL (ref 4.22–5.81)
RDW: 15.2 % (ref 11.5–15.5)
WBC: 5.9 10*3/uL (ref 4.0–10.5)
nRBC: 0 % (ref 0.0–0.2)

## 2019-08-03 NOTE — Progress Notes (Signed)
   08/03/19 0855  OBSTRUCTIVE SLEEP APNEA  Have you ever been diagnosed with sleep apnea through a sleep study? No  Do you snore loudly (loud enough to be heard through closed doors)?  1  Do you often feel tired, fatigued, or sleepy during the daytime (such as falling asleep during driving or talking to someone)? 0  Has anyone observed you stop breathing during your sleep? 1  Do you have, or are you being treated for high blood pressure? 1  BMI more than 35 kg/m2? 0  Age > 50 (1-yes) 1  Neck circumference greater than:Male 16 inches or larger, Male 17inches or larger? 0  Male Gender (Yes=1) 1  Obstructive Sleep Apnea Score 5

## 2019-08-03 NOTE — Progress Notes (Signed)
PCP - Dr. Marcy Siren Cardiologist - N/A  Chest x-ray - N/A EKG - 08/03/19 in epic Stress Test - N/A ECHO - N/A Cardiac Cath - N/A  Sleep Study - N/A CPAP - N/A  Fasting Blood Sugar - N/A Checks Blood Sugar _N/A____ times a day  Blood Thinner Instructions: N/A Aspirin Instructions: N/A Last Dose: N/A  Anesthesia review: N/A  Patient denies shortness of breath, fever, cough and chest pain at PAT appointment   Patient verbalized understanding of instructions that were given to them at the PAT appointment. Patient was also instructed that they will need to review over the PAT instructions again at home before surgery.

## 2019-08-04 LAB — URINE CULTURE: Culture: NO GROWTH

## 2019-08-07 ENCOUNTER — Other Ambulatory Visit (HOSPITAL_COMMUNITY)
Admission: RE | Admit: 2019-08-07 | Discharge: 2019-08-07 | Disposition: A | Discharge status: Home / Self Care | Payer: BC Managed Care – PPO | Source: Ambulatory Visit | Attending: Urology | Admitting: Urology

## 2019-08-07 DIAGNOSIS — Z01812 Encounter for preprocedural laboratory examination: Secondary | ICD-10-CM | POA: Diagnosis not present

## 2019-08-07 DIAGNOSIS — Z20822 Contact with and (suspected) exposure to covid-19: Secondary | ICD-10-CM | POA: Insufficient documentation

## 2019-08-07 LAB — SARS CORONAVIRUS 2 (TAT 6-24 HRS): SARS Coronavirus 2: NEGATIVE

## 2019-08-11 ENCOUNTER — Encounter (HOSPITAL_COMMUNITY): Payer: Self-pay | Admitting: Urology

## 2019-08-11 ENCOUNTER — Encounter (HOSPITAL_COMMUNITY)
Admission: RE | Disposition: A | Discharge status: Home / Self Care | Payer: Self-pay | Source: Home / Self Care | Attending: Urology

## 2019-08-11 ENCOUNTER — Inpatient Hospital Stay (HOSPITAL_COMMUNITY): Payer: BC Managed Care – PPO | Admitting: Certified Registered Nurse Anesthetist

## 2019-08-11 ENCOUNTER — Inpatient Hospital Stay (HOSPITAL_COMMUNITY)
Admission: RE | Admit: 2019-08-11 | Discharge: 2019-08-11 | DRG: 713 | Disposition: A | Discharge status: Home / Self Care | Payer: BC Managed Care – PPO | Attending: Urology | Admitting: Urology

## 2019-08-11 DIAGNOSIS — Z8611 Personal history of tuberculosis: Secondary | ICD-10-CM

## 2019-08-11 DIAGNOSIS — N138 Other obstructive and reflux uropathy: Secondary | ICD-10-CM | POA: Diagnosis present

## 2019-08-11 DIAGNOSIS — Z79899 Other long term (current) drug therapy: Secondary | ICD-10-CM

## 2019-08-11 DIAGNOSIS — R338 Other retention of urine: Secondary | ICD-10-CM | POA: Diagnosis not present

## 2019-08-11 DIAGNOSIS — N320 Bladder-neck obstruction: Secondary | ICD-10-CM | POA: Diagnosis not present

## 2019-08-11 DIAGNOSIS — Z9089 Acquired absence of other organs: Secondary | ICD-10-CM | POA: Diagnosis not present

## 2019-08-11 DIAGNOSIS — N401 Enlarged prostate with lower urinary tract symptoms: Secondary | ICD-10-CM | POA: Diagnosis not present

## 2019-08-11 DIAGNOSIS — I10 Essential (primary) hypertension: Secondary | ICD-10-CM | POA: Diagnosis present

## 2019-08-11 DIAGNOSIS — N400 Enlarged prostate without lower urinary tract symptoms: Secondary | ICD-10-CM | POA: Diagnosis present

## 2019-08-11 HISTORY — PX: TRANSURETHRAL RESECTION OF PROSTATE: SHX73

## 2019-08-11 SURGERY — TURP (TRANSURETHRAL RESECTION OF PROSTATE)
Anesthesia: General

## 2019-08-11 MED ORDER — ONDANSETRON HCL 4 MG/2ML IJ SOLN
INTRAMUSCULAR | Status: DC | PRN
  Administered 2019-08-11: 4 mg via INTRAVENOUS

## 2019-08-11 MED ORDER — ACETAMINOPHEN 500 MG PO TABS
1000.0000 mg | ORAL_TABLET | Freq: Once | ORAL | Status: AC
  Administered 2019-08-11: 1000 mg via ORAL
  Filled 2019-08-11: qty 2

## 2019-08-11 MED ORDER — LIDOCAINE 2% (20 MG/ML) 5 ML SYRINGE
INTRAMUSCULAR | Status: DC | PRN
  Administered 2019-08-11: 40 mg via INTRAVENOUS

## 2019-08-11 MED ORDER — 0.9 % SODIUM CHLORIDE (POUR BTL) OPTIME
TOPICAL | Status: DC | PRN
  Administered 2019-08-11: 1000 mL

## 2019-08-11 MED ORDER — PROPOFOL 10 MG/ML IV BOLUS
INTRAVENOUS | Status: DC | PRN
  Administered 2019-08-11: 50 mg via INTRAVENOUS
  Administered 2019-08-11: 110 mg via INTRAVENOUS

## 2019-08-11 MED ORDER — FENTANYL CITRATE (PF) 100 MCG/2ML IJ SOLN
INTRAMUSCULAR | Status: DC | PRN
  Administered 2019-08-11: 50 ug via INTRAVENOUS
  Administered 2019-08-11 (×2): 25 ug via INTRAVENOUS

## 2019-08-11 MED ORDER — LACTATED RINGERS IV SOLN: INTRAVENOUS | Status: DC

## 2019-08-11 MED ORDER — LIDOCAINE 2% (20 MG/ML) 5 ML SYRINGE
INTRAMUSCULAR | Status: AC
  Filled 2019-08-11: qty 5

## 2019-08-11 MED ORDER — DEXAMETHASONE SODIUM PHOSPHATE 10 MG/ML IJ SOLN
INTRAMUSCULAR | Status: DC | PRN
  Administered 2019-08-11: 4 mg via INTRAVENOUS

## 2019-08-11 MED ORDER — PROPOFOL 10 MG/ML IV BOLUS
INTRAVENOUS | Status: AC
  Filled 2019-08-11: qty 20

## 2019-08-11 MED ORDER — EPHEDRINE 5 MG/ML INJ
INTRAVENOUS | Status: AC
  Filled 2019-08-11: qty 10

## 2019-08-11 MED ORDER — SODIUM CHLORIDE 0.9 % IV SOLN
2.0000 g | INTRAVENOUS | Status: AC
  Administered 2019-08-11: 2 g via INTRAVENOUS
  Filled 2019-08-11: qty 20

## 2019-08-11 MED ORDER — FENTANYL CITRATE (PF) 100 MCG/2ML IJ SOLN
INTRAMUSCULAR | Status: AC
  Filled 2019-08-11: qty 2

## 2019-08-11 MED ORDER — SODIUM CHLORIDE 0.9 % IR SOLN
Status: DC | PRN
  Administered 2019-08-11: 30000 mL

## 2019-08-11 MED ORDER — ONDANSETRON HCL 4 MG/2ML IJ SOLN: 4.0000 mg | Freq: Once | INTRAMUSCULAR | Status: DC | PRN

## 2019-08-11 MED ORDER — CEPHALEXIN 500 MG PO CAPS: 500.0000 mg | ORAL_CAPSULE | Freq: Once | ORAL | 0 refills | Status: AC

## 2019-08-11 MED ORDER — ONDANSETRON HCL 4 MG/2ML IJ SOLN
INTRAMUSCULAR | Status: AC
  Filled 2019-08-11: qty 2

## 2019-08-11 MED ORDER — KETOROLAC TROMETHAMINE 15 MG/ML IJ SOLN: 15.0000 mg | Freq: Once | INTRAMUSCULAR | Status: DC | PRN

## 2019-08-11 MED ORDER — DEXAMETHASONE SODIUM PHOSPHATE 10 MG/ML IJ SOLN
INTRAMUSCULAR | Status: AC
  Filled 2019-08-11: qty 1

## 2019-08-11 MED ORDER — EPHEDRINE SULFATE-NACL 50-0.9 MG/10ML-% IV SOSY
PREFILLED_SYRINGE | INTRAVENOUS | Status: DC | PRN
  Administered 2019-08-11: 10 mg via INTRAVENOUS
  Administered 2019-08-11: 5 mg via INTRAVENOUS

## 2019-08-11 MED ORDER — FENTANYL CITRATE (PF) 100 MCG/2ML IJ SOLN: 25.0000 ug | INTRAMUSCULAR | Status: DC | PRN

## 2019-08-11 SURGICAL SUPPLY — 26 items
BAG DRN RND TRDRP ANRFLXCHMBR (UROLOGICAL SUPPLIES) ×1
BAG URINE DRAIN 2000ML AR STRL (UROLOGICAL SUPPLIES) ×2 IMPLANT
BAG URO CATCHER STRL LF (MISCELLANEOUS) ×2 IMPLANT
CATH FOLEY 2WAY SLVR 30CC 20FR (CATHETERS) IMPLANT
CATH FOLEY 3WAY 30CC 20FR (CATHETERS) ×2 IMPLANT
GLOVE BIO SURGEON STRL SZ 6.5 (GLOVE) ×1 IMPLANT
GLOVE BIO SURGEON STRL SZ7.5 (GLOVE) ×2 IMPLANT
GLOVE BIO SURGEONS STRL SZ 6.5 (GLOVE) ×1
GLOVE BIOGEL PI IND STRL 7.0 (GLOVE) IMPLANT
GLOVE BIOGEL PI IND STRL 7.5 (GLOVE) IMPLANT
GLOVE BIOGEL PI INDICATOR 7.0 (GLOVE) ×2
GLOVE BIOGEL PI INDICATOR 7.5 (GLOVE) ×2
GOWN STRL REUS W/ TWL XL LVL3 (GOWN DISPOSABLE) IMPLANT
GOWN STRL REUS W/TWL LRG LVL3 (GOWN DISPOSABLE) ×4 IMPLANT
GOWN STRL REUS W/TWL XL LVL3 (GOWN DISPOSABLE) ×3
HOLDER FOLEY CATH W/STRAP (MISCELLANEOUS) ×2 IMPLANT
LOOP CUT BIPOLAR 24F LRG (ELECTROSURGICAL) ×2 IMPLANT
MANIFOLD NEPTUNE II (INSTRUMENTS) ×2 IMPLANT
PACK CYSTO (CUSTOM PROCEDURE TRAY) ×2 IMPLANT
PLUG CATH AND CAP STER (CATHETERS) ×2 IMPLANT
SYR 30ML LL (SYRINGE) ×3 IMPLANT
SYR TOOMEY IRRIG 70ML (MISCELLANEOUS) ×3
SYRINGE TOOMEY IRRIG 70ML (MISCELLANEOUS) IMPLANT
TUBING CONNECTING 10 (TUBING) ×2 IMPLANT
TUBING CONNECTING 10' (TUBING) ×1
TUBING UROLOGY SET (TUBING) ×2 IMPLANT

## 2019-08-11 NOTE — Transfer of Care (Signed)
Immediate Anesthesia Transfer of Care Note  Patient: Justin Baxter  Procedure(s) Performed: TRANSURETHRAL RESECTION OF THE PROSTATE (TURP) (N/A )  Patient Location: PACU  Anesthesia Type:General  Level of Consciousness: drowsy and patient cooperative  Airway & Oxygen Therapy: Patient Spontanous Breathing and Patient connected to face mask oxygen  Post-op Assessment: Report given to RN and Post -op Vital signs reviewed and stable  Post vital signs: Reviewed and stable  Last Vitals:  Vitals Value Taken Time  BP 142/76 08/11/19 1250  Temp    Pulse 62 08/11/19 1251  Resp 12 08/11/19 1251  SpO2 100 % 08/11/19 1251  Vitals shown include unvalidated device data.  Last Pain:  Vitals:   08/11/19 0948  TempSrc: Oral         Complications: No apparent anesthesia complications

## 2019-08-11 NOTE — Anesthesia Procedure Notes (Addendum)
Procedure Name: LMA Insertion Date/Time: 08/11/2019 11:09 AM Performed by: Epimenio Sarin, CRNA Pre-anesthesia Checklist: Patient identified, Emergency Drugs available, Suction available, Patient being monitored and Timeout performed Patient Re-evaluated:Patient Re-evaluated prior to induction Oxygen Delivery Method: Circle system utilized Preoxygenation: Pre-oxygenation with 100% oxygen Induction Type: IV induction Ventilation: Mask ventilation without difficulty LMA: LMA with gastric port inserted LMA Size: 4.0 Number of attempts: 1 Placement Confirmation: positive ETCO2 and breath sounds checked- equal and bilateral Dental Injury: Teeth and Oropharynx as per pre-operative assessment

## 2019-08-11 NOTE — H&P (Signed)
CC/HPI: Pt presents today for pre-operative history and physical exam in anticipation of TURP by Dr. Arita Miss on 08/11/19. He is doing well and is without complaint. He states his LUTs resolved with Flomax.   Pt denies F/C, HA, CP, SOB, N/V, diarrhea/constipation, back pain, flank pain, hematuria, and dysuria.      HX:    CC/HPI: cc: urinary retention   06/04/19: TRUS prostate biopsy   04/28/19: Cystoscopy   04/27/19: 64 year old man with a known history of elevated PSA s/p negative TRUS prostate bx 2018 and BPH with urinary retention. Patient returns after failed void trial 2 weeks ago. He was started on Flomax at that time.   04/13/19: 64 year old man who is a patient of Dr. Annabell Howells with known history of elevated PSA and BPH status post a negative prostate biopsy in October 2018 which showed prostate size a 70 g. patient presents to the ER with urinary retention following ultra marathon 39 mi run last week. This has never happened before. At baseline he does have urinary urgency, frequency and nocturia. He is not taking any medication for this.     ALLERGIES: None   MEDICATIONS: Tamsulosin Hcl 0.4 mg capsule 1 capsule PO Q HS  Tamsulosin Hcl 0.4 mg capsule 2 capsule PO Daily  Valsartan 160 mg tablet     GU PSH: Catheterize For Residual - 04/27/2019 Cystoscopy - 05/07/2019 Prostate Needle Biopsy - 06/04/2019, 2018     NON-GU PSH: Surgical Pathology, Gross And Microscopic Examination For Prostate Needle - 06/04/2019, 2018 Tonsillectomy     GU PMH: Acute Cystitis/UTI, Patient has evidence of UTI on cath specimen today I will treat him with Macrobid for 10 days. Also send urine for culture for speciation an antibiotic sensitivities. - 04/28/2019 BPH w/LUTS, The I briefly discussed procedural intervention such as TURP versus rezum for BPH. He will need a cysto and trus for sizing. - 04/13/2019 Elevated PSA (Stable), Will repeat PSA after patient is not in acute retention. The - 04/13/2019, - 2018,  He has an elevated PSA that has gone from 5.25 to 8.9 over the past year. I am going to get him set up for a prostate [place] and biopsy. I have reviewed the risks of bleeding, infection and voiding difficulty. I will give him gentamycin and rocephin. 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 , - 2018 Urinary Retention, Catheter was replaced today after patient was unable to void. I will start him on Flomax 0.4 mg daily. Catheter remain in place for approximately 2 weeks. If he is unable to void is next visit then we will need to discuss BPH intervention/workup. 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   Tobacco Use Assessment Completed: Used Tobacco in last 30 days? Does not use smokeless tobacco. Does drink.  Does not use drugs. Drinks 1 caffeinated drink per day. Has not had a blood transfusion. 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     Notes: a Beer or wine 3-4x /week    REVIEW OF SYSTEMS:    GU Review Male:   Patient denies frequent urination, hard to postpone urination, burning/ pain with urination, get up at night to urinate, leakage of urine, stream starts  and stops, trouble starting your stream, have to strain to urinate , erection problems, and penile pain.  Gastrointestinal (Upper):   Patient denies nausea, vomiting, and indigestion/ heartburn.  Gastrointestinal (Lower):   Patient denies diarrhea and constipation.  Constitutional:   Patient denies fever, night sweats, weight loss, and fatigue.  Skin:   Patient denies skin rash/ lesion and itching.  Eyes:   Patient denies blurred vision and double vision.  Ears/ Nose/ Throat:   Patient denies sore throat and sinus problems.  Hematologic/Lymphatic:    Patient denies swollen glands and easy bruising.  Cardiovascular:   Patient denies leg swelling and chest pains.  Respiratory:   Patient denies cough and shortness of breath.  Endocrine:   Patient denies excessive thirst.  Musculoskeletal:   Patient denies back pain and joint pain.  Neurological:   Patient denies headaches and dizziness.  Psychologic:   Patient denies depression and anxiety.   VITAL SIGNS:      07/28/2019 03:40 PM  Weight 124 lb / 56.25 kg  BP 134/88 mmHg  Heart Rate 62 /min  Temperature 98.6 F / 37 C   MULTI-SYSTEM PHYSICAL EXAMINATION:    Constitutional: Well-nourished. No physical deformities. Normally developed. Good grooming.  Neck: Neck symmetrical, not swollen. Normal tracheal position.  Respiratory: Normal breath sounds. No labored breathing, no use of accessory muscles.   Cardiovascular: Regular rate and rhythm. No murmur, no gallop.   Lymphatic: No enlargement of neck, axillae, groin.  Skin: No paleness, no jaundice, no cyanosis. No lesion, no ulcer, no rash.  Neurologic / Psychiatric: Oriented to time, oriented to place, oriented to person. No depression, no anxiety, no agitation.  Gastrointestinal: No mass, no tenderness, no rigidity, non obese abdomen.  Eyes: Normal conjunctivae. Normal eyelids.  Ears, Nose, Mouth, and Throat: Left ear no scars, no lesions, no masses. Right ear no scars, no lesions, no masses. Nose no scars, no lesions, no masses. Normal hearing. Normal lips.  Musculoskeletal: Normal gait and station of head and neck.     PAST DATA REVIEWED:  Source Of History:  Patient  Records Review:   Previous Patient Records  Urine Test Review:   Urinalysis   07/28/19  Urinalysis  Urine Appearance Clear   Urine Color Yellow   Urine Glucose Neg mg/dL  Urine Bilirubin Neg mg/dL  Urine Ketones Neg mg/dL  Urine Specific Gravity 1.015   Urine Blood Neg ery/uL  Urine pH 6.5   Urine Protein Trace mg/dL  Urine Urobilinogen 0.2 mg/dL  Urine  Nitrites Neg   Urine Leukocyte Esterase Neg leu/uL   PROCEDURES:          Urinalysis - 81003 Dipstick Dipstick Cont'd  Color: Yellow Bilirubin: Neg mg/dL  Appearance: Clear Ketones: Neg mg/dL  Specific Gravity: 1.015 Blood: Neg ery/uL  pH: 6.5 Protein: Trace mg/dL  Glucose: Neg mg/dL Urobilinogen: 0.2 mg/dL    Nitrites: Neg    Leukocyte Esterase: Neg leu/uL    ASSESSMENT:      ICD-10 Details  1 GU:   BPH w/LUTS - N40.1    PLAN:           Schedule Return Visit/Planned Activity: Keep Scheduled Appointment - Schedule Surgery          Document Letter(s):  Created for Patient: Clinical Summary         Notes:   There are no changes in the patients history or physical exam since last evaluation by Dr. Claudia Desanctis. Pt is scheduled to undergo TURP on 08/11/19.

## 2019-08-11 NOTE — Anesthesia Postprocedure Evaluation (Signed)
Anesthesia Post Note  Patient: Justin Baxter  Procedure(s) Performed: TRANSURETHRAL RESECTION OF THE PROSTATE (TURP) (N/A )     Patient location during evaluation: PACU Anesthesia Type: General Level of consciousness: awake and alert Pain management: pain level controlled Vital Signs Assessment: post-procedure vital signs reviewed and stable Respiratory status: spontaneous breathing, nonlabored ventilation, respiratory function stable and patient connected to nasal cannula oxygen Cardiovascular status: blood pressure returned to baseline and stable Postop Assessment: no apparent nausea or vomiting Anesthetic complications: no    Last Vitals:  Vitals:   08/11/19 1330 08/11/19 1340  BP: 133/84 137/90  Pulse: 72 64  Resp: 14 15  Temp: 36.7 C 36.7 C  SpO2: 98% 96%    Last Pain:  Vitals:   08/11/19 1340  TempSrc:   PainSc: 0-No pain                 Milika Ventress P Gaither Biehn

## 2019-08-11 NOTE — Interval H&P Note (Signed)
History and Physical Interval Note: I discussed with patient surgical plan and postoperative care.  We discussed the risks of the procedure including but not limited to bleeding, infection, retrograde ejaculation, damage to surrounding structures including bladder and ureteral orifices as well as urinary sphincter, pain, need for Foley catheterization, inability to urinate following the procedure.  He understands postoperative limitations in activity.  Informed consent obtained.   08/11/2019 10:41 AM  Clyda Greener  has presented today for surgery, with the diagnosis of BENIGN PROSTATIC HYPERTROPHY, URINARY RETENTION.  The various methods of treatment have been discussed with the patient and family. After consideration of risks, benefits and other options for treatment, the patient has consented to  Procedure(s) with comments: TRANSURETHRAL RESECTION OF THE PROSTATE (TURP) (N/A) - 2 HRS as a surgical intervention.  The patient's history has been reviewed, patient examined, no change in status, stable for surgery.  I have reviewed the patient's chart and labs.  Questions were answered to the patient's satisfaction.     Dacoda Spallone D Nabil Bubolz

## 2019-08-11 NOTE — Discharge Instructions (Signed)

## 2019-08-11 NOTE — Anesthesia Preprocedure Evaluation (Addendum)
Anesthesia Evaluation  Patient identified by MRN, date of birth, ID band Patient awake    Reviewed: Allergy & Precautions, NPO status , Patient's Chart, lab work & pertinent test results  Airway Mallampati: II  TM Distance: >3 FB Neck ROM: Full    Dental  (+) Missing   Pulmonary neg pulmonary ROS,    Pulmonary exam normal breath sounds clear to auscultation       Cardiovascular hypertension, Pt. on medications Normal cardiovascular exam Rhythm:Regular Rate:Normal  ECG: SB, rate 51   Neuro/Psych negative neurological ROS  negative psych ROS   GI/Hepatic negative GI ROS, Neg liver ROS,   Endo/Other  negative endocrine ROS  Renal/GU negative Renal ROS     Musculoskeletal negative musculoskeletal ROS (+)   Abdominal   Peds  Hematology  (+) anemia ,   Anesthesia Other Findings BENIGN PROSTATIC HYPERTROPHY URINARY RETENTION  Reproductive/Obstetrics                            Anesthesia Physical Anesthesia Plan  ASA: II  Anesthesia Plan: General   Post-op Pain Management:    Induction: Intravenous  PONV Risk Score and Plan: 3 and Ondansetron, Dexamethasone and Treatment may vary due to age or medical condition  Airway Management Planned: LMA  Additional Equipment:   Intra-op Plan:   Post-operative Plan: Extubation in OR  Informed Consent: I have reviewed the patients History and Physical, chart, labs and discussed the procedure including the risks, benefits and alternatives for the proposed anesthesia with the patient or authorized representative who has indicated his/her understanding and acceptance.     Dental advisory given  Plan Discussed with: CRNA  Anesthesia Plan Comments:        Anesthesia Quick Evaluation

## 2019-08-11 NOTE — Op Note (Signed)
Preoperative diagnosis: 1. Bladder outlet obstruction secondary to BPH  Postoperative diagnosis:  1. Bladder outlet obstruction secondary to BPH  Procedure:  1. Cystoscopy 2. Transurethral resection of the prostate  Surgeon: Kasandra Knudsen, M.D.  Anesthesia: General  Complications: None  EBL: Minimal  Specimens: 1. Prostate chips  Indication: Justin Baxter is a patient with bladder outlet obstruction and urinary retention secondary to benign prostatic hyperplasia. After reviewing the management options for treatment, he elected to proceed with the above surgical procedure(s). We have discussed the potential benefits and risks of the procedure, side effects of the proposed treatment, the likelihood of the patient achieving the goals of the procedure, and any potential problems that might occur during the procedure or recuperation. Informed consent has been obtained.  Description of procedure:  The patient was taken to the operating room and general anesthesia was induced.  The patient was placed in the dorsal lithotomy position, prepped and draped in the usual sterile fashion, and preoperative antibiotics were administered. A preoperative time-out was performed.   Cystourethroscopy was performed.  The patient's urethra was examined and  demonstrated bilobar prostatic hypertrophy with a median lobe.   The bladder was then systematically examined in its entirety. There was no evidence of any bladder tumors, stones, or other mucosal pathology.  The ureteral orifices were identified and marked so as to be avoided during the procedure.  The prostate adenoma was then resected utilizing loop cautery resection with the bipolar cutting loop.  The prostate adenoma from the bladder neck back to the verumontanum was resected beginning at the six o'clock position and then extended to include the right and left lobes of the prostate and anterior prostate. Care was taken not to resect distal to  the verumontanum.   Hemostasis was then achieved with the cautery and the bladder was emptied and reinspected with no significant bleeding noted at the end of the procedure.    A 22Fr 3-way catheter was then placed into the bladder with inflow port capped and outflow port to gravity drainage.  The patient appeared to tolerate the procedure well and without complications.  The patient was able to be awakened and transferred to the recovery unit in satisfactory condition.   Plan: Void trial in the office in 3-5 days

## 2019-08-12 DIAGNOSIS — N400 Enlarged prostate without lower urinary tract symptoms: Secondary | ICD-10-CM | POA: Diagnosis present

## 2019-08-12 LAB — SURGICAL PATHOLOGY

## 2020-03-23 DIAGNOSIS — R972 Elevated prostate specific antigen [PSA]: Secondary | ICD-10-CM | POA: Diagnosis not present

## 2020-03-25 DIAGNOSIS — R972 Elevated prostate specific antigen [PSA]: Secondary | ICD-10-CM | POA: Diagnosis not present

## 2020-04-01 DIAGNOSIS — R972 Elevated prostate specific antigen [PSA]: Secondary | ICD-10-CM | POA: Diagnosis not present

## 2020-12-29 ENCOUNTER — Other Ambulatory Visit: Payer: Self-pay

## 2020-12-29 ENCOUNTER — Ambulatory Visit (INDEPENDENT_AMBULATORY_CARE_PROVIDER_SITE_OTHER): Payer: BC Managed Care – PPO | Admitting: Sports Medicine

## 2020-12-29 VITALS — Ht 60.0 in | Wt 120.0 lb

## 2020-12-29 DIAGNOSIS — R269 Unspecified abnormalities of gait and mobility: Secondary | ICD-10-CM

## 2020-12-30 NOTE — Progress Notes (Signed)
Patient ID: Justin Baxter, male   DOB: [birth year], 65 y.o.   MRN: [id number]  Justin Baxter presents today for new custom orthotics.  He has had custom orthotics in the past.  He is without complaint today.  His current orthotics are quite old but have been quite helpful.  He has decreased his running somewhat but still enjoys it.  New custom orthotics were created as below.  I added a first ray post to both orthotics (this was done in the past).  Review of his notes also shows that he had a foam heel pad of some sort at the right heel but I did not add that today.  I can obviously added at a later date if needed.  He will follow-up prn.   Patient was fitted for a : standard, cushioned, semi-rigid orthotic. The orthotic was heated and afterward the patient stood on the orthotic blank positioned on the orthotic stand. The patient was positioned in subtalar neutral position and 10 degrees of ankle dorsiflexion in a weight bearing stance. After completion of molding, a stable base was applied to the orthotic blank. The blank was ground to a stable position for weight bearing. Size: 9 Base: Blue EVA Posting: Bilateral first ray posts Additional orthotic padding: None

## 2021-04-15 IMAGING — CT CT ABD-PELV W/ CM
2 of 5 series · 15 of 46 positions shown, 17 images · IV contrast (APPLIED)
Comparison: None.

CLINICAL DATA: 63-year-old male with lower abdominal pain and
nausea for the past 2 days

EXAM:
CT ABDOMEN AND PELVIS WITH CONTRAST
TECHNIQUE: Multidetector CT imaging of the abdomen and pelvis was performed
using the standard protocol following bolus administration of
intravenous contrast.
CONTRAST:  100mL OMNIPAQUE IOHEXOL 300 MG/ML  SOLN

[Series 3: abdomen 5.0 · axial · 0.67mm/px · z∈[+766,+1111]mm · 12 of 81 slices shown, 14 images]
[im 6/81  soft-tissue]
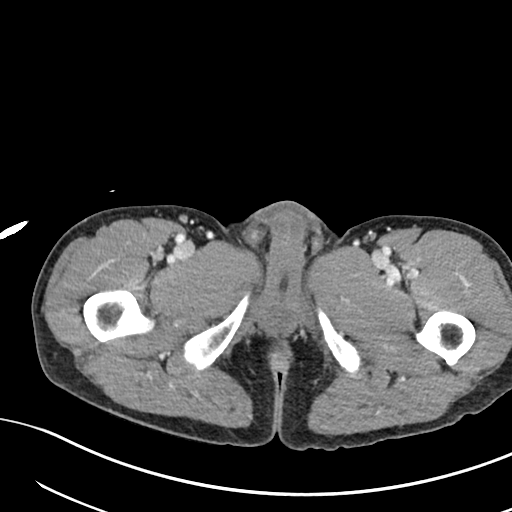
[im 6/81  bone]
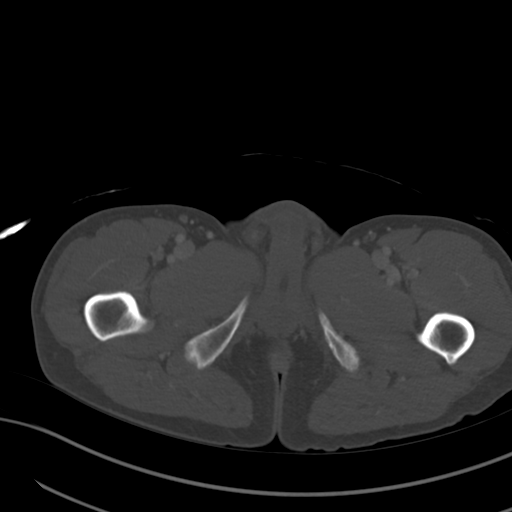
[im 12/81  soft-tissue]
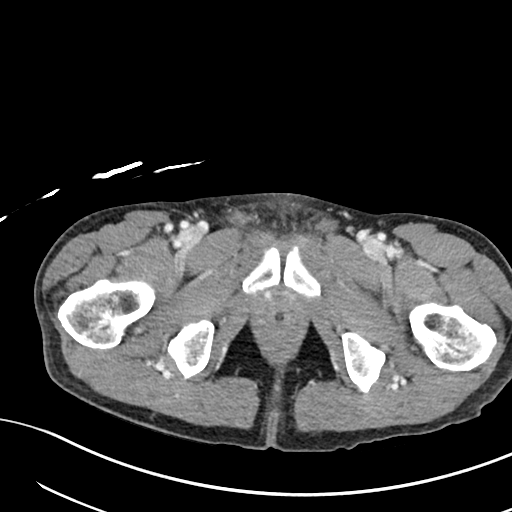
[im 18/81  soft-tissue]
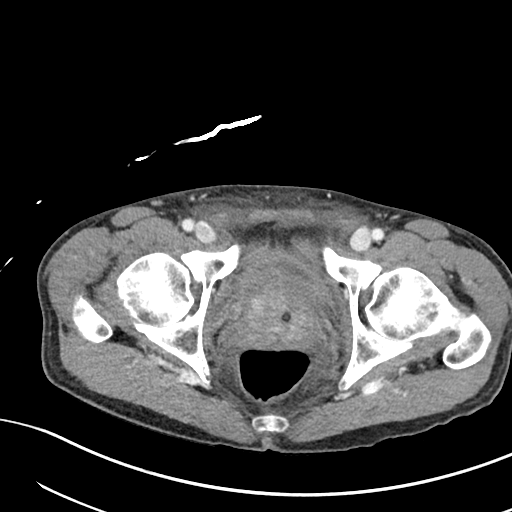
[im 23/81  soft-tissue]
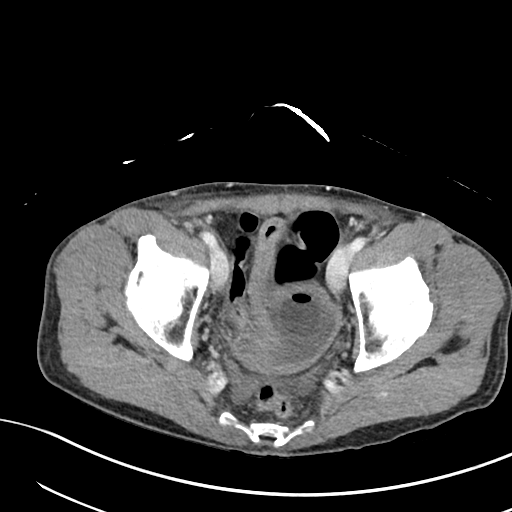
[im 29/81  soft-tissue]
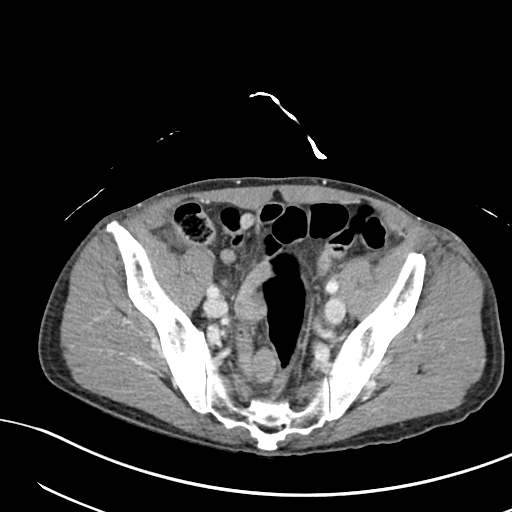
[im 35/81  soft-tissue]
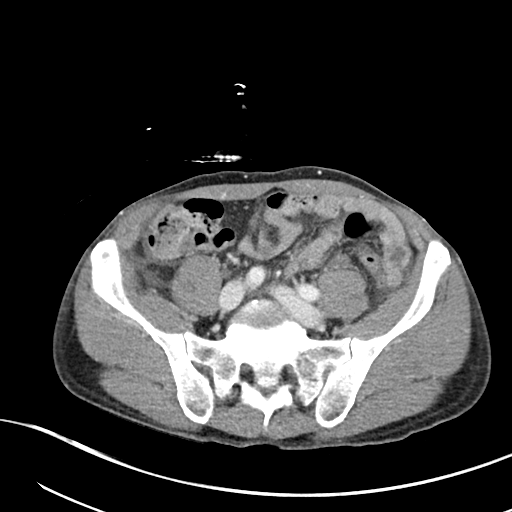
[im 46/81  soft-tissue]
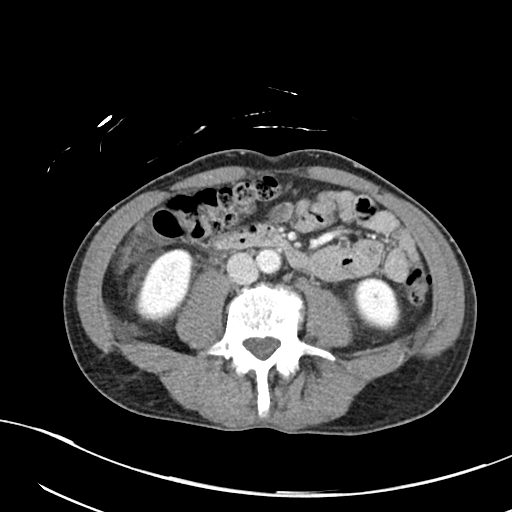
[im 52/81  soft-tissue]
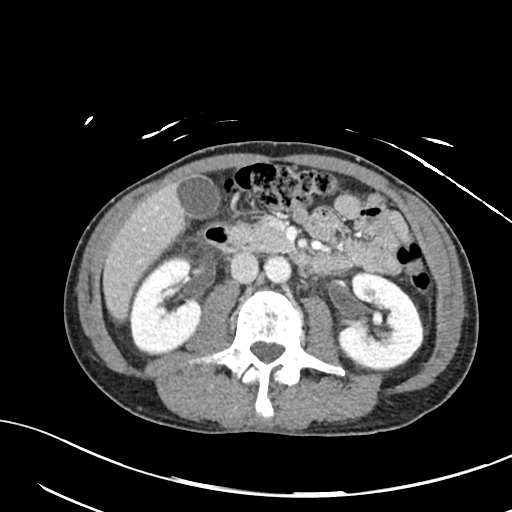
[im 58/81  soft-tissue]
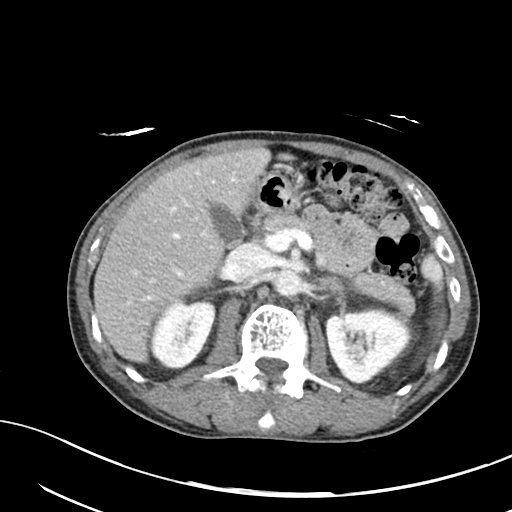
[im 58/81  bone]
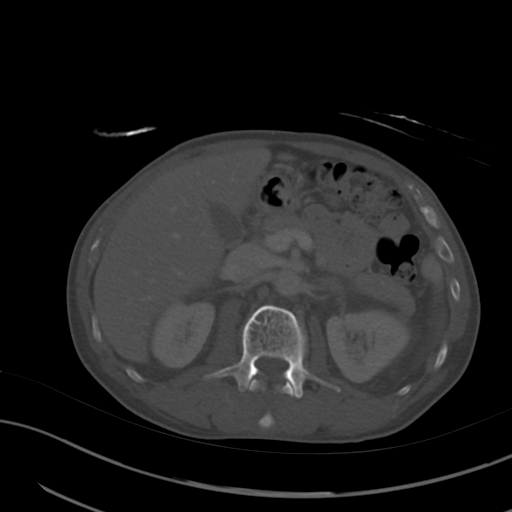
[im 63/81  soft-tissue]
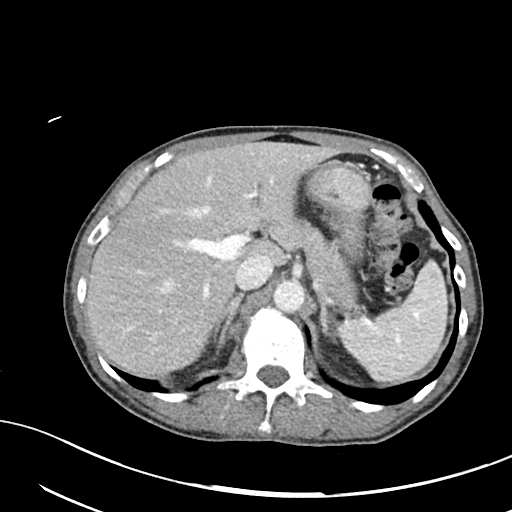
[im 69/81  soft-tissue]
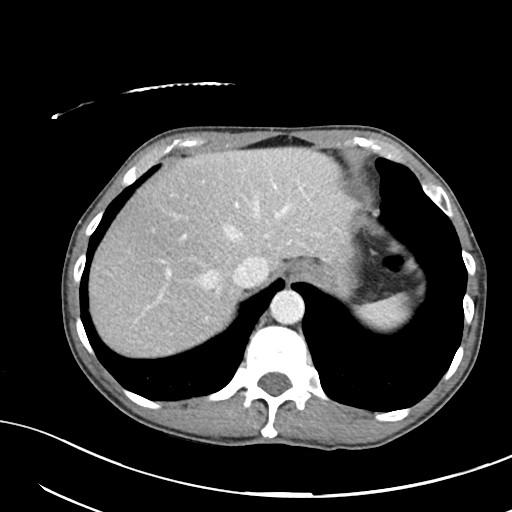
[im 75/81  soft-tissue]
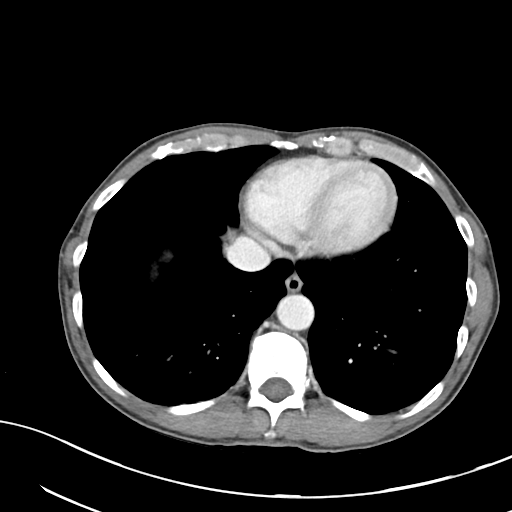

[Series 6: abdomen 3.0 mpr cor · coronal · 0.66mm/px · 3 of 76 slices shown]
[im 26/76  soft-tissue]
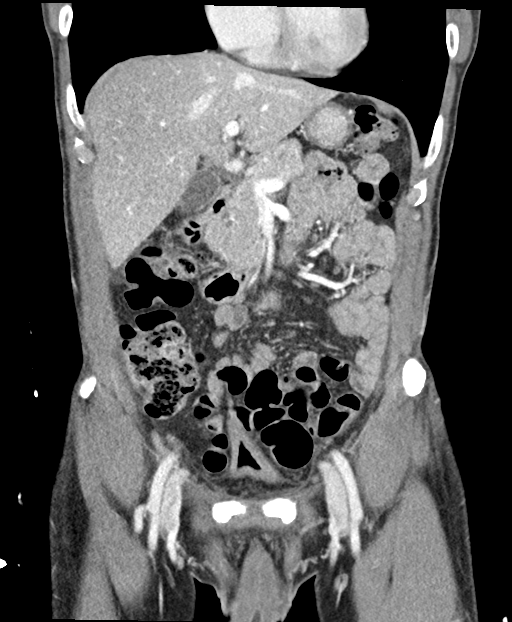
[im 34/76  soft-tissue]
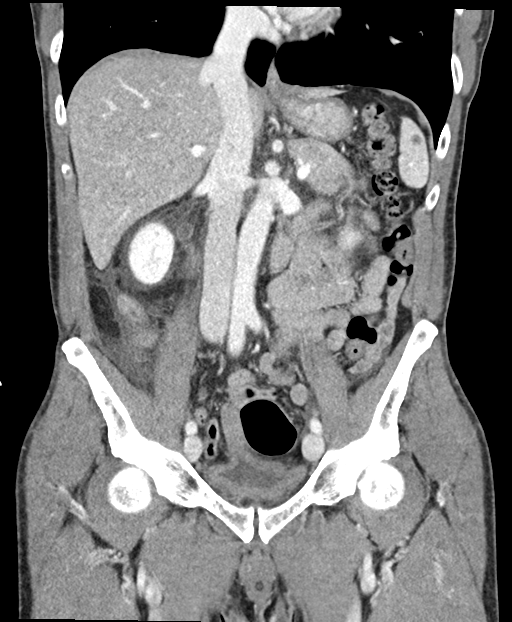
[im 42/76  soft-tissue]
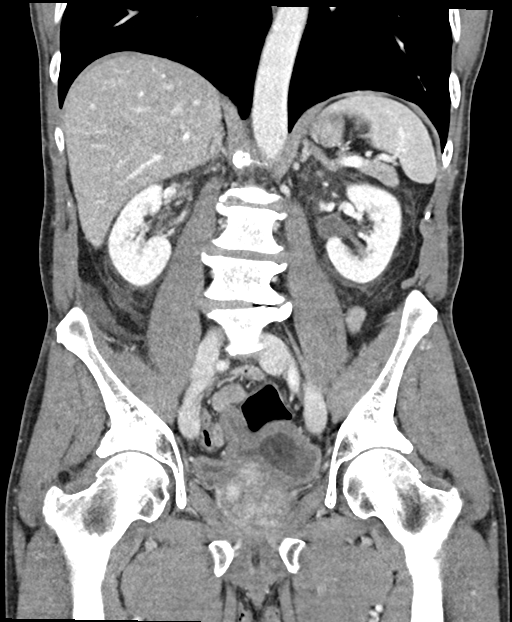

[15 of 46 positions shown; findings below may reference images not displayed]

FINDINGS: Lower chest: The lung bases are clear. Visualized cardiac structures
are within normal limits for size. No pericardial effusion.
Unremarkable visualized distal thoracic esophagus.

Hepatobiliary: Normal hepatic contour and morphology. No discrete
hepatic lesions. Normal appearance of the gallbladder. No intra or
extrahepatic biliary ductal dilatation.

Pancreas: Unremarkable. No pancreatic ductal dilatation or
surrounding inflammatory changes.

Spleen: Normal in size and contour. Tiny subcentimeter circumscribed
low-attenuation lesion in the anterior spleen is too small for
accurate characterization but statistically highly likely a benign
cyst.

Adrenals/Urinary Tract: Normal adrenal glands bilaterally. No
evidence of hydronephrosis or enhancing renal mass. Circumscribed
simple renal cyst measuring 1 cm in the anterior interpolar left
kidney. Nonspecific asymmetric perinephric stranding present
surrounding the right kidney. There is slight hyperenhancement of
the urothelium at the UPJ. Interstitial stranding is present in the
perinephric fat, particularly along the inferior aspect of the
kidney. A Foley catheter is present in the collapsed bladder.

Stomach/Bowel: Stomach is within normal limits. Appendix appears
normal. No evidence of bowel wall thickening, distention, or
inflammatory changes.

Vascular/Lymphatic: No evidence of aneurysm or significant
atherosclerotic plaque. No suspicious lymphadenopathy.

Reproductive: Prostatomegaly with heterogeneous parenchymal
enhancement. There is a somewhat nodular focus of enhancement in the
right prostatic lobe measuring up to 1.0 cm.

Other: No abdominal wall hernia or abnormality. No abdominopelvic
ascites.

Musculoskeletal: No acute fracture or aggressive appearing lytic or
blastic osseous lesion. L4-L5 degenerative disc disease. Mild L2-L3
degenerative disc disease.
IMPRESSION: 1. Mild urothelial enhancement on the right at the UPJ/proximal
ureter with associated nonspecific perinephric stranding adjacent to
the lower pole of the kidney and extending inferiorly in the
retroperitoneal perinephric fat. Findings suggest either secondary
inflammation due to recent passage of a renal stone (no stones
currently visualized) versus an infectious/inflammatory process such
as ascending urinary tract infection. There is no radiographic
evidence of pyelonephritis.
2. Prostatomegaly with heterogeneous and somewhat nodular
enhancement. While nonspecific, prostate cancer can have a similar
appearance. Recommend correlation with serum PSA.
3. Additional ancillary findings as above.

## 2021-11-08 DIAGNOSIS — R972 Elevated prostate specific antigen [PSA]: Secondary | ICD-10-CM | POA: Diagnosis not present

## 2022-05-14 ENCOUNTER — Emergency Department (HOSPITAL_COMMUNITY)
Admission: EM | Admit: 2022-05-14 | Discharge: 2022-05-14 | Disposition: A | Discharge status: Home / Self Care | Payer: BC Managed Care – PPO | Attending: Emergency Medicine | Admitting: Emergency Medicine

## 2022-05-14 ENCOUNTER — Emergency Department (HOSPITAL_COMMUNITY): Payer: BC Managed Care – PPO

## 2022-05-14 ENCOUNTER — Other Ambulatory Visit: Payer: Self-pay

## 2022-05-14 ENCOUNTER — Encounter (HOSPITAL_COMMUNITY): Payer: Self-pay

## 2022-05-14 DIAGNOSIS — I9589 Other hypotension: Secondary | ICD-10-CM

## 2022-05-14 DIAGNOSIS — R4182 Altered mental status, unspecified: Secondary | ICD-10-CM

## 2022-05-14 DIAGNOSIS — Z79899 Other long term (current) drug therapy: Secondary | ICD-10-CM | POA: Diagnosis not present

## 2022-05-14 DIAGNOSIS — R482 Apraxia: Secondary | ICD-10-CM | POA: Diagnosis not present

## 2022-05-14 DIAGNOSIS — E861 Hypovolemia: Secondary | ICD-10-CM | POA: Diagnosis not present

## 2022-05-14 DIAGNOSIS — E860 Dehydration: Secondary | ICD-10-CM | POA: Insufficient documentation

## 2022-05-14 DIAGNOSIS — R42 Dizziness and giddiness: Secondary | ICD-10-CM

## 2022-05-14 DIAGNOSIS — G939 Disorder of brain, unspecified: Secondary | ICD-10-CM | POA: Insufficient documentation

## 2022-05-14 DIAGNOSIS — R531 Weakness: Secondary | ICD-10-CM | POA: Diagnosis not present

## 2022-05-14 DIAGNOSIS — Z1152 Encounter for screening for COVID-19: Secondary | ICD-10-CM | POA: Insufficient documentation

## 2022-05-14 DIAGNOSIS — G9389 Other specified disorders of brain: Secondary | ICD-10-CM | POA: Diagnosis not present

## 2022-05-14 DIAGNOSIS — I10 Essential (primary) hypertension: Secondary | ICD-10-CM | POA: Diagnosis not present

## 2022-05-14 DIAGNOSIS — R0602 Shortness of breath: Secondary | ICD-10-CM | POA: Diagnosis not present

## 2022-05-14 DIAGNOSIS — I639 Cerebral infarction, unspecified: Secondary | ICD-10-CM | POA: Diagnosis not present

## 2022-05-14 LAB — I-STAT CHEM 8, ED
BUN: 7 mg/dL — ABNORMAL LOW (ref 8–23)
Calcium, Ion: 1.12 mmol/L — ABNORMAL LOW (ref 1.15–1.40)
Chloride: 105 mmol/L (ref 98–111)
Creatinine, Ser: 0.7 mg/dL (ref 0.61–1.24)
Glucose, Bld: 149 mg/dL — ABNORMAL HIGH (ref 70–99)
HCT: 35 % — ABNORMAL LOW (ref 39.0–52.0)
Hemoglobin: 11.9 g/dL — ABNORMAL LOW (ref 13.0–17.0)
Potassium: 3.2 mmol/L — ABNORMAL LOW (ref 3.5–5.1)
Sodium: 140 mmol/L (ref 135–145)
TCO2: 24 mmol/L (ref 22–32)

## 2022-05-14 LAB — DIFFERENTIAL
Abs Immature Granulocytes: 0.01 10*3/uL (ref 0.00–0.07)
Basophils Absolute: 0 10*3/uL (ref 0.0–0.1)
Basophils Relative: 1 %
Eosinophils Absolute: 0.1 10*3/uL (ref 0.0–0.5)
Eosinophils Relative: 2 %
Immature Granulocytes: 0 %
Lymphocytes Relative: 30 %
Lymphs Abs: 1.5 10*3/uL (ref 0.7–4.0)
Monocytes Absolute: 0.3 10*3/uL (ref 0.1–1.0)
Monocytes Relative: 6 %
Neutro Abs: 3 10*3/uL (ref 1.7–7.7)
Neutrophils Relative %: 61 %

## 2022-05-14 LAB — CBC
HCT: 33.7 % — ABNORMAL LOW (ref 39.0–52.0)
Hemoglobin: 11.3 g/dL — ABNORMAL LOW (ref 13.0–17.0)
MCH: 30.1 pg (ref 26.0–34.0)
MCHC: 33.5 g/dL (ref 30.0–36.0)
MCV: 89.9 fL (ref 80.0–100.0)
Platelets: 235 10*3/uL (ref 150–400)
RBC: 3.75 MIL/uL — ABNORMAL LOW (ref 4.22–5.81)
RDW: 15.5 % (ref 11.5–15.5)
WBC: 4.8 10*3/uL (ref 4.0–10.5)
nRBC: 0 % (ref 0.0–0.2)

## 2022-05-14 LAB — URINALYSIS, ROUTINE W REFLEX MICROSCOPIC
Bilirubin Urine: NEGATIVE
Glucose, UA: NEGATIVE mg/dL
Hgb urine dipstick: NEGATIVE
Ketones, ur: NEGATIVE mg/dL
Leukocytes,Ua: NEGATIVE
Nitrite: NEGATIVE
Protein, ur: NEGATIVE mg/dL
Specific Gravity, Urine: 1.006 (ref 1.005–1.030)
pH: 7 (ref 5.0–8.0)

## 2022-05-14 LAB — RAPID URINE DRUG SCREEN, HOSP PERFORMED
Amphetamines: NOT DETECTED
Barbiturates: NOT DETECTED
Benzodiazepines: NOT DETECTED
Cocaine: NOT DETECTED
Opiates: NOT DETECTED
Tetrahydrocannabinol: NOT DETECTED

## 2022-05-14 LAB — COMPREHENSIVE METABOLIC PANEL
ALT: 14 U/L (ref 0–44)
AST: 23 U/L (ref 15–41)
Albumin: 3.3 g/dL — ABNORMAL LOW (ref 3.5–5.0)
Alkaline Phosphatase: 42 U/L (ref 38–126)
Anion gap: 8 (ref 5–15)
BUN: 8 mg/dL (ref 8–23)
CO2: 23 mmol/L (ref 22–32)
Calcium: 8.4 mg/dL — ABNORMAL LOW (ref 8.9–10.3)
Chloride: 106 mmol/L (ref 98–111)
Creatinine, Ser: 0.81 mg/dL (ref 0.61–1.24)
GFR, Estimated: >60 mL/min (ref >60)
Glucose, Bld: 151 mg/dL — ABNORMAL HIGH (ref 70–99)
Potassium: 3.1 mmol/L — ABNORMAL LOW (ref 3.5–5.1)
Sodium: 137 mmol/L (ref 135–145)
Total Bilirubin: 0.4 mg/dL (ref 0.3–1.2)
Total Protein: 5.9 g/dL — ABNORMAL LOW (ref 6.5–8.1)

## 2022-05-14 LAB — PROTIME-INR
INR: 1.1 (ref 0.8–1.2)
Prothrombin Time: 13.7 seconds (ref 11.4–15.2)

## 2022-05-14 LAB — TROPONIN I (HIGH SENSITIVITY)
Troponin I (High Sensitivity): 2 ng/L (ref <18)
Troponin I (High Sensitivity): 3 ng/L (ref <18)

## 2022-05-14 LAB — RESP PANEL BY RT-PCR (RSV, FLU A&B, COVID)  RVPGX2
Influenza A by PCR: NEGATIVE
Influenza B by PCR: NEGATIVE
Resp Syncytial Virus by PCR: NEGATIVE
SARS Coronavirus 2 by RT PCR: NEGATIVE

## 2022-05-14 LAB — APTT: aPTT: 25 seconds (ref 24–36)

## 2022-05-14 LAB — CBG MONITORING, ED: Glucose-Capillary: 98 mg/dL (ref 70–99)

## 2022-05-14 LAB — ETHANOL: Alcohol, Ethyl (B): <10 mg/dL (ref <10)

## 2022-05-14 LAB — LACTIC ACID, PLASMA: Lactic Acid, Venous: 1.5 mmol/L (ref 0.5–1.9)

## 2022-05-14 MED ORDER — LACTATED RINGERS IV BOLUS
1000.0000 mL | Freq: Once | INTRAVENOUS | Status: AC
  Administered 2022-05-14: 1000 mL via INTRAVENOUS

## 2022-05-14 MED ORDER — POTASSIUM CHLORIDE CRYS ER 20 MEQ PO TBCR
40.0000 meq | EXTENDED_RELEASE_TABLET | Freq: Once | ORAL | Status: AC
  Administered 2022-05-14: 40 meq via ORAL
  Filled 2022-05-14: qty 2

## 2022-05-14 MED ORDER — ASPIRIN 325 MG PO TBEC
650.0000 mg | DELAYED_RELEASE_TABLET | Freq: Once | ORAL | Status: DC
  Filled 2022-05-14: qty 2

## 2022-05-14 MED ORDER — SODIUM CHLORIDE 0.9% FLUSH
3.0000 mL | Freq: Once | INTRAVENOUS | Status: AC
  Administered 2022-05-14: 3 mL via INTRAVENOUS

## 2022-05-14 NOTE — ED Provider Notes (Signed)
Justin Baxter   CSN: [id number] Arrival date & time: 05/14/22  7564  An emergency department physician performed an initial assessment on this suspected stroke patient at 0948.  History  Chief Complaint  Patient presents with   Code Stroke    Justin Baxter is a 67 y.o. male.  67 year old male with a history of BPH and hypertension who presents to the emergency department with generalized weakness and slurred speech.  Patient woke up this morning and reported that he was feeling weak and dizzy.  Says that it worsens when he stands.  Was at work and was last known well at 8:45 AM.  Coworkers noticed that he seemed to be slurring his speech and then slumped over in his chair and they called 911.  When EMS arrived his blood pressure was in the 33I systolic and he received 951 mL of fluid with improvement of his blood pressure to 884 systolic.  Patient reports that he is having some difficulty getting words out at this time he thinks this may have been due to a dry mouth.  Denies any focal weakness or numbness but says that both of his legs feel weaker than usual and he is having some difficulty walking.  No facial droop.  Not on blood thinners.  Denies any pain including chest pain, abdominal pain, or headache.        Home Medications Prior to Admission medications   Medication Sig Start Date End Date Taking? Authorizing Provider  halobetasol (ULTRAVATE) 0.05 % cream Apply 1 application topically 2 (two) times daily as needed (psoriasis).   Yes [provider]  iron polysaccharides (NIFEREX) 150 MG capsule Take 150 mg by mouth 2 (two) times daily. 04/25/22  Yes [provider]  tacrolimus (PROTOPIC) 0.03 % ointment Apply 1 application topically 2 (two) times daily as needed (psoriasis).   Yes [provider]  tamsulosin (FLOMAX) 0.4 MG CAPS capsule Take 0.4 mg by mouth in the morning and at bedtime. 06/25/19   Yes [provider]  valsartan (DIOVAN) 160 MG tablet Take 160 mg by mouth daily. 02/04/14  Yes [provider]      Allergies    Patient has no known allergies.    Review of Systems   Review of Systems  Physical Exam Updated Vital Signs BP 127/80   Pulse 66   Temp (!) 97.5 F (36.4 C) (Oral)   Resp 11   Wt 58.4 kg   SpO2 97%   BMI 25.14 kg/m  Physical Exam Vitals and nursing Baxter reviewed.  Constitutional:      General: He is not in acute distress.    Appearance: He is well-developed.  HENT:     Head: Normocephalic and atraumatic.     Right Ear: External ear normal.     Left Ear: External ear normal.     Nose: Nose normal.     Mouth/Throat:     Mouth: Mucous membranes are dry.     Pharynx: Oropharynx is clear.  Eyes:     Extraocular Movements: Extraocular movements intact.     Conjunctiva/sclera: Conjunctivae normal.     Pupils: Pupils are equal, round, and reactive to light.  Cardiovascular:     Rate and Rhythm: Normal rate and regular rhythm.     Heart sounds: Normal heart sounds.  Pulmonary:     Effort: Pulmonary effort is normal. No respiratory distress.     Breath sounds: Normal breath sounds.  Abdominal:     General: There is no distension.     Palpations: Abdomen is soft. There is no mass.     Tenderness: There is no abdominal tenderness. There is no guarding.  Musculoskeletal:     Cervical back: Normal range of motion and neck supple.     Right lower leg: No edema.     Left lower leg: No edema.  Skin:    General: Skin is warm and dry.  Neurological:     General: No focal deficit present.     Mental Status: He is alert and oriented to person, place, and time. Mental status is at baseline.     Cranial Nerves: No cranial nerve deficit.     Sensory: No sensory deficit.     Motor: No weakness.     Coordination: Coordination normal.  Psychiatric:        Mood and Affect: Mood normal.        Behavior: Behavior normal.     ED  Results / Procedures / Treatments   Labs (all labs ordered are listed, but only abnormal results are displayed) Labs Reviewed  CBC - Abnormal; Notable for the following components:      Result Value   RBC 3.75 (*)    Hemoglobin 11.3 (*)    HCT 33.7 (*)    All other components within normal limits  COMPREHENSIVE METABOLIC PANEL - Abnormal; Notable for the following components:   Potassium 3.1 (*)    Glucose, Bld 151 (*)    Calcium 8.4 (*)    Total Protein 5.9 (*)    Albumin 3.3 (*)    All other components within normal limits  URINALYSIS, ROUTINE W REFLEX MICROSCOPIC - Abnormal; Notable for the following components:   Color, Urine STRAW (*)    All other components within normal limits  I-STAT CHEM 8, ED - Abnormal; Notable for the following components:   Potassium 3.2 (*)    BUN 7 (*)    Glucose, Bld 149 (*)    Calcium, Ion 1.12 (*)    Hemoglobin 11.9 (*)    HCT 35.0 (*)    All other components within normal limits  RESP PANEL BY RT-PCR (RSV, FLU A&B, COVID)  RVPGX2  PROTIME-INR  APTT  DIFFERENTIAL  ETHANOL  LACTIC ACID, PLASMA  RAPID URINE DRUG SCREEN, HOSP PERFORMED  CBG MONITORING, ED  TROPONIN I (HIGH SENSITIVITY)  TROPONIN I (HIGH SENSITIVITY)    EKG EKG Interpretation  Date/Time:  Monday May 14 2022 10:44:52 EST Ventricular Rate:  73 PR Interval:  153 QRS Duration: 97 QT Interval:  416 QTC Calculation: 459 R Axis:   85 Text Interpretation: Sinus rhythm Consider right ventricular hypertrophy Confirmed by Vonita Moss [phone]) on 05/14/2022 10:50:34 AM  Radiology DG Chest Portable 1 View  Result Date: 05/14/2022 CLINICAL DATA:  Shortness of breath, stroke code EXAM: PORTABLE CHEST 1 VIEW COMPARISON:  None Available. FINDINGS: Cardiac and mediastinal contours are within normal limits. No focal pulmonary opacity. No pleural effusion or pneumothorax. No acute osseous abnormality. IMPRESSION: No acute cardiopulmonary process. Electronically Signed   By:  Wiliam Ke M.D.   On: 05/14/2022 12:34   MR BRAIN WO CONTRAST  Result Date: 05/14/2022 CLINICAL DATA:  Provided history: Difficulty speaking. Aphemia. EXAM: MRI HEAD WITHOUT CONTRAST TECHNIQUE: Multiplanar, multiecho pulse sequences of the brain and surrounding structures were obtained without intravenous contrast. COMPARISON:  Non-contrast head CT performed earlier today 05/14/2022. FINDINGS: Brain: No age advanced or lobar predominant parenchymal  atrophy. Mild multifocal T2 FLAIR hyperintense signal abnormality within the cerebral white matter, nonspecific but most often secondary to chronic small vessel ischemia. Punctate calcification within the anterior right frontal lobe, as described on the head CT performed earlier today. Partially empty sella turcica, a nonspecific finding. There is no acute infarct. No evidence of an intracranial mass. No extra-axial fluid collection. No midline shift. Vascular: Maintained flow voids within the proximal large arterial vessels. Skull and upper cervical spine: Nonspecific 9 mm T1 hypointense and T2 FLAIR hyperintense lesion within the left parietal calvarium (series 6, image 27). Sinuses/Orbits: No mass or acute finding within the imaged orbits. No significant paranasal sinus disease. Other: Trace fluid within the right mastoid air cells. IMPRESSION: 1. No evidence of acute intracranial abnormality. 2. Mild multifocal T2 FLAIR hyperintense signal abnormality within the cerebral white matter, nonspecific but most often secondary to chronic small vessel ischemia. 3. 9 mm lesion within the left parietal calvarium, as described. While this may reflect an atypical hemangioma, the imaging features are nonspecific and this lesion remains indeterminate in etiology. Consider a six-month follow-up brain MRI to ensure stability. 4. Trace fluid within the right mastoid air cells. Electronically Signed   By: Kellie Simmering D.O.   On: 05/14/2022 10:54   CT HEAD CODE STROKE WO  CONTRAST  Result Date: 05/14/2022 CLINICAL DATA:  Code stroke. Neuro deficit, acute, stroke suspected. Weakness and speech disturbance. EXAM: CT HEAD WITHOUT CONTRAST TECHNIQUE: Contiguous axial images were obtained from the base of the skull through the vertex without intravenous contrast. RADIATION DOSE REDUCTION: This exam was performed according to the departmental dose-optimization program which includes automated exposure control, adjustment of the mA and/or kV according to patient size and/or use of iterative reconstruction technique. COMPARISON:  None Available. FINDINGS: Brain: No evidence old or acute infarction, mass lesion, hemorrhage, hydrocephalus or extra-axial collection. Punctate parenchymal calcification in the right frontal lobe may relate to old/inactive/insignificant neurocysticercosis. Vascular: No abnormal vascular finding. Skull: Normal Sinuses/Orbits: Clear/normal Other: None ASPECTS ([place] Stroke Program Early CT Score) - Ganglionic level infarction (caudate, lentiform nuclei, internal capsule, insula, M1-M3 cortex): 7 - Supraganglionic infarction (M4-M6 cortex): 3 Total score (0-10 with 10 being normal): 10 IMPRESSION: 1. No acute head CT finding. Benign punctate parenchymal calcification in the right frontal lobe could relate to distant and inactive neurocysticercosis. 2. Aspects is 10. These results were communicated to Dr. Cheral Marker at 10:06 am on 05/14/2022 by text page via the [facility] messaging system. Electronically Signed   By: Jahmar Chimes M.D.   On: 05/14/2022 10:11    Procedures Procedures   Medications Ordered in ED Medications  sodium chloride flush (NS) 0.9 % injection 3 mL (3 mLs Intravenous Given 05/14/22 1215)  lactated ringers bolus 1,000 mL (0 mLs Intravenous Stopped 05/14/22 1153)  potassium chloride SA (KLOR-CON M) CR tablet 40 mEq (40 mEq Oral Given 05/14/22 1152)    ED Course/ Medical Decision Making/ A&P Clinical Course as of 05/14/22 1939  Mon May 14, 2022   1130 Additional history obtained per the patient and his wife.  They state that he runs up to 50 miles frequently.  Says that he was running 5 hours this weekend and thinks that he may have not been staying well-hydrated.  Reports that his symptoms did not start at all until this morning when he was driving into work and felt dizzy.  Denies any other infectious symptoms at this time.  No other significant medical conditions aside from hypertension and reports that  he has been taking his valsartan as prescribed without any recent dose changes. [RP]    Clinical Course User Index [RP] Rondel Baton, MD                            Medical Decision Making Amount and/or Complexity of Data Reviewed Labs: ordered. Radiology: ordered.  Risk Prescription drug management.   Sequan Auxier is a 67 y.o. male with comorbidities that complicate the patient evaluation including hypertension who presented to the emergency department with presyncopal episode with difficulty speaking  Initial Ddx:  Arrhythmia, dehydration, stroke, infection  MDM:  Feel the patient may have had presyncopal episode due to dehydration given his dry mucous membranes and later reports of feeling that he has not eaten or drink as much as usual recently.  Patient blood pressure improvement with the fluids by EMS also supports this hypothesis.  Also in the differential is arrhythmia also obtain EKG also will get troponin to evaluate for any other cardiac causes.  With his difficulty speaking code stroke was called and they were at the bedside.  They feel that this is less likely so CT head and MRI of the brain will be obtained.  Patient could have early onset infection leading to prodrome with fatigue that could be causing his symptoms as well.  Plan:  Labs Troponin Urinalysis COVID/flu Lactate CT head Code stroke activation IV fluids  ED Summary/Re-evaluation:  Patient reassessed in the emergency department.  Was  feeling markedly better after the fluids.  Was able to ambulate without difficulty after getting an additional bolus.  Discussed with him and his wife and appears that the patient is an avid runner and did have decreased p.o. intake recently.  Suspect that he may have been dehydrated.  EKG did not show any concerns for arrhythmia.  Stroke workup was negative and neurology did not feel that any additional workup was required.  MRI did show incidental finding of possible hemangioma that will require repeat 2-month follow-up MRI.  Discharged home with instructions to follow-up with his primary doctor.  This patient presents to the ED for concern of complaints listed in HPI, this involves an extensive number of treatment options, and is a complaint that carries with it a high risk of complications and morbidity. Disposition including potential need for admission considered.   Dispo: DC Home. Return precautions discussed including, but not limited to, those listed in the AVS. Allowed pt time to ask questions which were answered fully prior to dc.  Additional history obtained from spouse Records reviewed Outpatient Clinic Notes The following labs were independently interpreted: Chemistry and show  hypokalemia I independently reviewed the following imaging with scope of interpretation limited to determining acute life threatening conditions related to emergency care: CT Head and agree with the radiologist interpretation with the following exceptions: None I personally reviewed and interpreted cardiac monitoring: normal sinus rhythm  I personally reviewed and interpreted the pt's EKG: see above for interpretation  I have reviewed the patients home medications and made adjustments as needed Consults: Neurology Social Determinants of health:  None  Final Clinical Impression(s) / ED Diagnoses Final diagnoses:  Dizziness  Dehydration  Brain lesion    Rx / DC Orders ED Discharge Orders     None          Rondel Baton, MD 05/14/22 1939

## 2022-05-14 NOTE — ED Notes (Signed)
Pt ambulated well independently

## 2022-05-14 NOTE — ED Triage Notes (Signed)
Pt arrived via [facility] from work as a code a stroke. Pt's co-workers called EMS, because pt was slumped to left side in chair and pt was having slurred speech and expressive aphasia. Per EMS, pt told them he was feeling sluggish when he woke up this morning. EMS gave NS 500 ml. Per EMS, pt was having int expressive aphasia with them. Per EMS, pt was having trouble walking to the EMS stretcher. Pt is A&Ox4. LKW 0845 today. Pt stated when he was slumped in chair he remembers that and did not lose consciousness. Per EMS, in route pt c/o HA, nausea and dizziness.

## 2022-05-14 NOTE — Discharge Instructions (Signed)
You were seen for your dizziness in the emergency department.   At home, please stay well hydrated.    Follow-up with your primary doctor in 2-3 days regarding your visit.    Return immediately to the emergency department if you experience any of the following: chest pain, difficulty breathing, fainting, or any other concerning symptoms.    Thank you for visiting our Emergency Department. It was a pleasure taking care of you today.

## 2022-05-14 NOTE — Code Documentation (Signed)
Stroke Response Nurse Documentation Code Documentation  Justin Baxter is a 67 y.o. male arriving to [facility]  via [facility] on 05/14/22 with past medical hx of HTN, BPH. On No antithrombotic. Code stroke was activated by EMS.   Patient from work, coworkers called EMS when they found him slumped over with slurred speech and expressive aphasia. Patient denies LOC. SBP 90s en route, 500cc bolus given. Speech fluctuating with EMS. Complaints of nausea, h/a and dizziness.   Stroke team at the bedside on patient arrival. Labs drawn and patient cleared for CT by Dr. Philip Aspen. Patient to CT with team. NIHSS 1, see documentation for details and code stroke times. Patient with dysarthria  on exam.   The following imaging was completed:  CT Head and MRI. Patient is not a candidate for IV Thrombolytic due to negative imaging. Patient is not a candidate for IR due to no LVO.   Care Plan: cancel code stroke, MRI negative.  Bedside handoff with ED RN Alana.    Candace Cruise K  Rapid Response RN

## 2022-05-14 NOTE — Consult Note (Signed)
NEURO HOSPITALIST CONSULT NOTE   Requestig physician: Dr. Philip Aspen  Reason for Consult: Acute onset of dysarthria and dysphasia in the context of hypotension  History obtained from:  Patient, Chart, EMS  HPI:                                                                                                                                          Justin Baxter is a 67 y.o. male with a PMHx of HTN, BPH, elevated PSA s/p TURP who presents to the ED via EMS as code stroke for acute onset of dysarthria and dysphasia in the context of hypotension. EMS reports that they were called by his work after he was found slumped over in his chair to the left and they called the code stroke due to his difficulty speaking. Per report and per patient, he was not completely out at that time. He reports he was leaning back in his chair and remembers feeling sleepy and dizzy "like rocking on a boat". He reports he was sick with COVID in September but otherwise denies any recent fever or other infectious symptoms. Per EMS his BP was 98/51 initially. CBG 195. Last known well was 8:45am. He received 500cc IVF en route. Patient reports when speaking he can think of the words, but when trying to move his mouth, it feels as though the "gears aren't working quite right" with regard to the muscles required to speak - he reports some difficulty with moving the muscles of his mouth but is otherwise able to speak. He reports some slight bilateral weakness symmetrically in his legs. He reports some dizziness and cloudy vision. He reports the dizziness feels more like a wobbling and did not get worse with standing. He reports he woke up around 4:30 am and felt sluggish. He reports he ate his breakfast around 5:30am and took his BP medication this morning. He reports some dry mouth. He denies any palpitations or history of Afib. He denies having a pacemaker. No history of tobacco use per chart review.   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  Social History:  reports that he has never smoked. He has never used smokeless tobacco. He reports current alcohol use of about 1.0 standard drink of alcohol per week. He reports that he does not use drugs.  No Known Allergies  MEDICATIONS:                                                                                                                     I have reviewed the patient's current medications. Prior to Admission: Tamsulosin 0.4 BID, losartan 160 daily Current Meds: LR bolus     ROS:                                                                                                                                       History obtained from the patient  General ROS: negative for - fever, night sweats, weight gain or weight loss Ophthalmic ROS: Positive for cloudy vision.  Respiratory ROS: negative for - cough, hemoptysis, shortness of breath or wheezing Cardiovascular ROS: negative for - chest pain or irregular heartbeat Gastrointestinal ROS: negative for - abdominal pain, nausea/vomiting  Neurological ROS: as noted in HPI  Weight 58.4 kg.   General Examination:                                                                                                       Physical Exam  HEENT-  [place]/AT. No nuchal rigidity.    Lungs- Respirations unlabored Extremities- No  edema  Neurological Examination Mental Status: Awake and alert. Oriented x 5. Speech is fluent but with subtle dysarthria in the context of dry oral mucosa. Naming and comprehension intact. Repetition intact. Thought process is linear.  Cranial Nerves: II: PERRL. Temporal visual fields intact with no extinction to DSS.   III,IV, VI: No ptosis. EOMI. No nystagmus.  V: Temp sensation equal bilaterally.  VII: No facial droop or other weakness with grimace, smile or cheek-puff VIII: Hearing aids bilaterally.  IX,X: No hypophonia or hoarseness XI: Symmetric  XII: Midline tongue extension Motor: Right : Upper extremity   5/5    Left:     Upper extremity   5/5  Lower extremity   5/5     Lower extremity   5/5 Normal tone throughout; no atrophy noted Sensory: Temp and light touch intact throughout, bilaterally. No extinction to DSS.  Deep Tendon Reflexes: 2+ and symmetric throughout Plantars: Right: downgoing   Left: downgoing Cerebellar: No ataxia with FNF or H-S bilaterally Gait: Deferred  NIHSS: 1   Lab Results: Basic Metabolic Panel: Recent Labs  Lab 05/14/22 0956  NA 140  K 3.2*  CL 105  GLUCOSE 149*  BUN 7*  CREATININE 0.70    CBC: Recent Labs  Lab 05/14/22 0956  HGB 11.9*  HCT 35.0*    Cardiac Enzymes: No results for input(s): "CKTOTAL", "CKMB", "CKMBINDEX", "TROPONINI" in the last 168 hours.  Lipid Panel: No results for input(s): "CHOL", "TRIG", "HDL", "CHOLHDL", "VLDL", "LDLCALC" in the last 168 hours.  Imaging: No results found.   Assessment: Justin Baxter is a 67 y.o. male with a PMHx of HTN, BPH, elevated PSA s/p TURP who presents to the ED via EMS as code stroke for acute onset of dysarthria and dysphasia in the context of hypotension.   - NIHSS of 1 for subtle dysarthria. Of note, dry oral mucosa is noted on exam and patient also endorses that this is affecting his speech.  - Exam findings as well as history do not militate in favor of stroke. TNK not  indicated.  - DDx for underlying etiology for his presentation includes dehydration, orthostatic hypotension, electrolyte disturbance with TIA felt to be unlikely given that his symptoms improved in parallel with a fluid bolus and improvement in his BP.  - CT head unremarkable. I-stat notable for hypokalemia (K 3.2), anemia (Hgb 11.9). - MRI brain ordered to r/o acute infarct.    Impression: At this time, suspect that his symptoms are more likely due to hypotension and poor fluid intake given his low BP on EMS arrival and dry mouth. His ABCD2 score for TIA is 2 points (age, speech disturbance without weakness).  Recommendations: - STAT MRI brain without contrast - IVF - Monitor BP - STAT ASA 650 mg po crushed  If MRI positive, then stroke work-up as below:  - CTA/MRA if not already obtained - TTE w/ bubble - Check A1c and LDL + add statin per guidelines - continue ASA 81 daily - q4 hr neuro checks - STAT head CT for any change in neuro exam - Tele - PT/OT/SLP - Stroke education - Amb referral to neurology upon discharge    Assisting with this consultation: Rolanda Lundborg, MD, PGY-1   Electronically signed: Dr. Kerney Elbe 05/14/2022, 10:10 AM

## 2022-06-25 NOTE — Progress Notes (Signed)
Coags obtained as a part of the stroke order set to evaluate for coagulopathy in the setting of possible ICH.

## 2022-10-19 DIAGNOSIS — H5213 Myopia, bilateral: Secondary | ICD-10-CM | POA: Diagnosis not present

## 2022-12-26 DIAGNOSIS — N401 Enlarged prostate with lower urinary tract symptoms: Secondary | ICD-10-CM | POA: Diagnosis not present

## 2022-12-26 DIAGNOSIS — R972 Elevated prostate specific antigen [PSA]: Secondary | ICD-10-CM | POA: Diagnosis not present

## 2022-12-26 DIAGNOSIS — N5201 Erectile dysfunction due to arterial insufficiency: Secondary | ICD-10-CM | POA: Diagnosis not present

## 2022-12-26 DIAGNOSIS — R3914 Feeling of incomplete bladder emptying: Secondary | ICD-10-CM | POA: Diagnosis not present

## 2023-12-24 DIAGNOSIS — R972 Elevated prostate specific antigen [PSA]: Secondary | ICD-10-CM | POA: Diagnosis not present

## 2023-12-30 DIAGNOSIS — R3914 Feeling of incomplete bladder emptying: Secondary | ICD-10-CM | POA: Diagnosis not present

## 2023-12-30 DIAGNOSIS — R972 Elevated prostate specific antigen [PSA]: Secondary | ICD-10-CM | POA: Diagnosis not present

## 2023-12-30 DIAGNOSIS — N401 Enlarged prostate with lower urinary tract symptoms: Secondary | ICD-10-CM | POA: Diagnosis not present

## 2024-03-10 ENCOUNTER — Ambulatory Visit (INDEPENDENT_AMBULATORY_CARE_PROVIDER_SITE_OTHER): Admitting: Family Medicine

## 2024-03-10 ENCOUNTER — Encounter: Payer: Self-pay | Admitting: Family Medicine

## 2024-03-10 VITALS — BP 110/84 | Ht 60.0 in | Wt 120.0 lb

## 2024-03-10 DIAGNOSIS — S80211A Abrasion, right knee, initial encounter: Secondary | ICD-10-CM

## 2024-03-11 NOTE — Progress Notes (Signed)
  PCP: Sun, Vyvyan, MD  Subjective:   HPI: Patient is a 68 y.o. male here for right knee laceration.  Patient states on Saturday he was running a race that was over 60 miles and at mile 5, he fell down and suffered and had a superficial abrasion with a laceration over the inferior patella tendon.  He states that he has no knee pain and continue to run without issue.  He is concerned overall that there is infection of the skin.  He denies any redness, swelling, purulent drainage.  He did also mention some MSK back pain that occurs on longer runs.  He states that this back pain occurs around 10 hours of his runs.  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     iron polysaccharides (NIFEREX) 150 MG capsule Take 150 mg by mouth 2 (two) times daily.     tacrolimus (PROTOPIC) 0.03 % ointment Apply 1 application topically 2 (two) times daily as needed (psoriasis).     tamsulosin (FLOMAX) 0.4 MG CAPS capsule Take 0.4 mg by mouth in the morning and at bedtime.     valsartan (DIOVAN) 160 MG tablet Take 160 mg by mouth daily.  1   No current facility-administered medications on file prior to visit.    BP 110/84   Ht 5' (1.524 m)   Wt 120 lb (54.4 kg)   BMI 23.44 kg/m        Objective:   Physical Exam:  Gen: NAD, comfortable in exam room Right knee Inspection: Irregular horizontal abrasion with a deep area of laceration inferior to the patellar tendon.  No erythema, edema, warmth or drainage Palpation: No drainage elicited with palpation ROM: Full extension and flexion with crepitus throughout Neuro: Sensation is equal and  intact  Assessment/Plan:   Justin Baxter is a 69 y.o. male who was seen today for the following: 1. Abrasion of right knee, initial encounter (Primary) - Superficial abrasion of the right knee without evidence of infection - The area of the lateral laceration repair is past the point of option to close - Continue conservative management of the topical antibiotic cream, with bandage - Area needs to remain moist and this should heal without any concern - If there is increased redness, purulence or changes he is invited to return - Follow-up as needed   Follow-up/Education:   No follow-ups on file.   May return sooner as needed and encouraged to call/e-mail for additional questions or  worsening symptoms in the interim.  Krystal Lowing, DO Sports Medicine Fellow 03/11/2024 9:02 AM
